# Patient Record
Sex: Male | Born: 1955 | Race: White | Hispanic: No | Marital: Married | Smoking: Never smoker
Health system: Southern US, Community
[De-identification: ages and names within clinical notes are randomized; demographics above are authoritative.]

## PROBLEM LIST (undated history)

## (undated) DIAGNOSIS — Z21 Asymptomatic human immunodeficiency virus [HIV] infection status: Secondary | ICD-10-CM

## (undated) DIAGNOSIS — B20 Human immunodeficiency virus [HIV] disease: Secondary | ICD-10-CM

## (undated) DIAGNOSIS — G4730 Sleep apnea, unspecified: Secondary | ICD-10-CM

## (undated) HISTORY — PX: TUMOR REMOVAL: SHX12

## (undated) HISTORY — PX: COSMETIC SURGERY: SHX468

## (undated) HISTORY — DX: Sleep apnea, unspecified: G47.30

---

## 1987-11-01 ENCOUNTER — Encounter (INDEPENDENT_AMBULATORY_CARE_PROVIDER_SITE_OTHER): Payer: Self-pay | Admitting: *Deleted

## 1987-11-01 LAB — CONVERTED CEMR LAB: CD4 T Cell Abs: 760

## 1997-07-21 ENCOUNTER — Encounter: Admission: RE | Admit: 1997-07-21 | Discharge: 1997-07-21 | Payer: Self-pay | Admitting: Infectious Diseases

## 1997-09-16 ENCOUNTER — Encounter: Admission: RE | Admit: 1997-09-16 | Discharge: 1997-09-16 | Payer: Self-pay | Admitting: Infectious Diseases

## 1997-09-17 ENCOUNTER — Ambulatory Visit (HOSPITAL_COMMUNITY): Admission: RE | Admit: 1997-09-17 | Discharge: 1997-09-17 | Payer: Self-pay | Admitting: General Surgery

## 1997-09-28 ENCOUNTER — Encounter: Admission: RE | Admit: 1997-09-28 | Discharge: 1997-09-28 | Payer: Self-pay | Admitting: Infectious Diseases

## 1998-03-18 ENCOUNTER — Encounter: Admission: RE | Admit: 1998-03-18 | Discharge: 1998-03-18 | Payer: Self-pay | Admitting: Infectious Diseases

## 1998-04-01 ENCOUNTER — Encounter: Admission: RE | Admit: 1998-04-01 | Discharge: 1998-04-01 | Payer: Self-pay | Admitting: Infectious Diseases

## 1998-11-09 ENCOUNTER — Ambulatory Visit (HOSPITAL_COMMUNITY): Admission: RE | Admit: 1998-11-09 | Discharge: 1998-11-09 | Payer: Self-pay | Admitting: Infectious Diseases

## 1998-11-09 ENCOUNTER — Encounter: Admission: RE | Admit: 1998-11-09 | Discharge: 1998-11-09 | Payer: Self-pay | Admitting: Infectious Diseases

## 1998-12-03 ENCOUNTER — Encounter: Admission: RE | Admit: 1998-12-03 | Discharge: 1998-12-03 | Payer: Self-pay | Admitting: Infectious Diseases

## 1998-12-29 ENCOUNTER — Encounter: Admission: RE | Admit: 1998-12-29 | Discharge: 1998-12-29 | Payer: Self-pay | Admitting: Internal Medicine

## 1999-01-12 ENCOUNTER — Encounter: Admission: RE | Admit: 1999-01-12 | Discharge: 1999-01-12 | Payer: Self-pay | Admitting: Internal Medicine

## 1999-01-26 ENCOUNTER — Encounter: Admission: RE | Admit: 1999-01-26 | Discharge: 1999-01-26 | Payer: Self-pay | Admitting: Infectious Diseases

## 1999-02-09 ENCOUNTER — Encounter: Admission: RE | Admit: 1999-02-09 | Discharge: 1999-02-09 | Payer: Self-pay | Admitting: Infectious Diseases

## 1999-02-24 ENCOUNTER — Encounter: Admission: RE | Admit: 1999-02-24 | Discharge: 1999-02-24 | Payer: Self-pay | Admitting: Infectious Diseases

## 1999-02-24 ENCOUNTER — Ambulatory Visit (HOSPITAL_COMMUNITY): Admission: RE | Admit: 1999-02-24 | Discharge: 1999-02-24 | Payer: Self-pay | Admitting: Infectious Diseases

## 1999-12-07 ENCOUNTER — Encounter: Admission: RE | Admit: 1999-12-07 | Discharge: 1999-12-07 | Payer: Self-pay | Admitting: Infectious Diseases

## 1999-12-07 ENCOUNTER — Ambulatory Visit (HOSPITAL_COMMUNITY): Admission: RE | Admit: 1999-12-07 | Discharge: 1999-12-07 | Payer: Self-pay | Admitting: Infectious Diseases

## 1999-12-29 ENCOUNTER — Encounter: Admission: RE | Admit: 1999-12-29 | Discharge: 1999-12-29 | Payer: Self-pay | Admitting: Infectious Diseases

## 2000-03-12 ENCOUNTER — Encounter: Admission: RE | Admit: 2000-03-12 | Discharge: 2000-03-12 | Payer: Self-pay | Admitting: Infectious Diseases

## 2000-04-23 ENCOUNTER — Ambulatory Visit (HOSPITAL_COMMUNITY): Admission: RE | Admit: 2000-04-23 | Discharge: 2000-04-23 | Payer: Self-pay | Admitting: Infectious Diseases

## 2000-04-23 ENCOUNTER — Encounter: Admission: RE | Admit: 2000-04-23 | Discharge: 2000-04-23 | Payer: Self-pay | Admitting: Infectious Diseases

## 2000-05-10 ENCOUNTER — Encounter: Admission: RE | Admit: 2000-05-10 | Discharge: 2000-05-10 | Payer: Self-pay | Admitting: Infectious Diseases

## 2000-08-02 ENCOUNTER — Ambulatory Visit (HOSPITAL_COMMUNITY): Admission: RE | Admit: 2000-08-02 | Discharge: 2000-08-02 | Payer: Self-pay | Admitting: Infectious Diseases

## 2000-08-16 ENCOUNTER — Encounter: Admission: RE | Admit: 2000-08-16 | Discharge: 2000-08-16 | Payer: Self-pay | Admitting: Infectious Diseases

## 2000-09-19 ENCOUNTER — Encounter: Admission: RE | Admit: 2000-09-19 | Discharge: 2000-09-19 | Payer: Self-pay | Admitting: Internal Medicine

## 2000-12-26 ENCOUNTER — Ambulatory Visit (HOSPITAL_COMMUNITY): Admission: RE | Admit: 2000-12-26 | Discharge: 2000-12-26 | Payer: Self-pay | Admitting: Infectious Diseases

## 2000-12-26 ENCOUNTER — Encounter: Admission: RE | Admit: 2000-12-26 | Discharge: 2000-12-26 | Payer: Self-pay | Admitting: Infectious Diseases

## 2001-01-10 ENCOUNTER — Encounter: Admission: RE | Admit: 2001-01-10 | Discharge: 2001-01-10 | Payer: Self-pay | Admitting: Infectious Diseases

## 2001-10-01 ENCOUNTER — Encounter: Admission: RE | Admit: 2001-10-01 | Discharge: 2001-10-01 | Payer: Self-pay | Admitting: Internal Medicine

## 2001-10-01 ENCOUNTER — Ambulatory Visit (HOSPITAL_COMMUNITY): Admission: RE | Admit: 2001-10-01 | Discharge: 2001-10-01 | Payer: Self-pay | Admitting: Infectious Diseases

## 2001-10-10 ENCOUNTER — Encounter: Admission: RE | Admit: 2001-10-10 | Discharge: 2001-10-10 | Payer: Self-pay | Admitting: Infectious Diseases

## 2001-12-10 ENCOUNTER — Ambulatory Visit (HOSPITAL_COMMUNITY): Admission: RE | Admit: 2001-12-10 | Discharge: 2001-12-10 | Payer: Self-pay | Admitting: Infectious Diseases

## 2001-12-10 ENCOUNTER — Encounter: Admission: RE | Admit: 2001-12-10 | Discharge: 2001-12-10 | Payer: Self-pay | Admitting: Infectious Diseases

## 2001-12-26 ENCOUNTER — Encounter: Admission: RE | Admit: 2001-12-26 | Discharge: 2001-12-26 | Payer: Self-pay | Admitting: Infectious Diseases

## 2002-02-06 ENCOUNTER — Ambulatory Visit (HOSPITAL_COMMUNITY): Admission: RE | Admit: 2002-02-06 | Discharge: 2002-02-06 | Payer: Self-pay | Admitting: Infectious Diseases

## 2002-02-06 ENCOUNTER — Encounter: Admission: RE | Admit: 2002-02-06 | Discharge: 2002-02-06 | Payer: Self-pay | Admitting: Infectious Diseases

## 2002-02-27 ENCOUNTER — Encounter: Admission: RE | Admit: 2002-02-27 | Discharge: 2002-02-27 | Payer: Self-pay | Admitting: Infectious Diseases

## 2003-03-05 ENCOUNTER — Encounter: Admission: RE | Admit: 2003-03-05 | Discharge: 2003-03-05 | Payer: Self-pay | Admitting: Infectious Diseases

## 2003-03-05 ENCOUNTER — Ambulatory Visit (HOSPITAL_COMMUNITY): Admission: RE | Admit: 2003-03-05 | Discharge: 2003-03-05 | Payer: Self-pay | Admitting: Infectious Diseases

## 2003-03-05 ENCOUNTER — Encounter: Payer: Self-pay | Admitting: Infectious Diseases

## 2003-03-19 ENCOUNTER — Encounter: Admission: RE | Admit: 2003-03-19 | Discharge: 2003-03-19 | Payer: Self-pay | Admitting: Infectious Diseases

## 2003-04-30 ENCOUNTER — Encounter: Admission: RE | Admit: 2003-04-30 | Discharge: 2003-04-30 | Payer: Self-pay | Admitting: Infectious Diseases

## 2003-06-11 ENCOUNTER — Ambulatory Visit (HOSPITAL_COMMUNITY): Admission: RE | Admit: 2003-06-11 | Discharge: 2003-06-11 | Payer: Self-pay | Admitting: Infectious Diseases

## 2003-06-11 ENCOUNTER — Encounter: Admission: RE | Admit: 2003-06-11 | Discharge: 2003-06-11 | Payer: Self-pay | Admitting: Infectious Diseases

## 2003-06-25 ENCOUNTER — Encounter: Admission: RE | Admit: 2003-06-25 | Discharge: 2003-06-25 | Payer: Self-pay | Admitting: Infectious Diseases

## 2003-08-24 ENCOUNTER — Encounter: Admission: RE | Admit: 2003-08-24 | Discharge: 2003-08-24 | Payer: Self-pay | Admitting: Infectious Diseases

## 2004-09-08 ENCOUNTER — Ambulatory Visit: Payer: Self-pay | Admitting: Infectious Diseases

## 2004-09-08 ENCOUNTER — Encounter (INDEPENDENT_AMBULATORY_CARE_PROVIDER_SITE_OTHER): Payer: Self-pay | Admitting: *Deleted

## 2004-09-08 ENCOUNTER — Ambulatory Visit (HOSPITAL_COMMUNITY): Admission: RE | Admit: 2004-09-08 | Discharge: 2004-09-08 | Payer: Self-pay | Admitting: Infectious Diseases

## 2004-09-22 ENCOUNTER — Ambulatory Visit: Payer: Self-pay | Admitting: Infectious Diseases

## 2005-02-08 ENCOUNTER — Ambulatory Visit: Payer: Self-pay | Admitting: Infectious Diseases

## 2005-02-08 ENCOUNTER — Ambulatory Visit (HOSPITAL_COMMUNITY): Admission: RE | Admit: 2005-02-08 | Discharge: 2005-02-08 | Payer: Self-pay | Admitting: Infectious Diseases

## 2005-02-08 ENCOUNTER — Encounter (INDEPENDENT_AMBULATORY_CARE_PROVIDER_SITE_OTHER): Payer: Self-pay | Admitting: *Deleted

## 2005-02-08 LAB — CONVERTED CEMR LAB
CD4 Count: 610 microliters
HIV 1 RNA Quant: 399 copies/mL

## 2005-03-07 ENCOUNTER — Ambulatory Visit: Payer: Self-pay | Admitting: Infectious Diseases

## 2005-08-17 ENCOUNTER — Encounter: Admission: RE | Admit: 2005-08-17 | Discharge: 2005-08-17 | Payer: Self-pay | Admitting: Infectious Diseases

## 2005-08-17 ENCOUNTER — Ambulatory Visit: Payer: Self-pay | Admitting: Infectious Diseases

## 2005-08-17 ENCOUNTER — Encounter (INDEPENDENT_AMBULATORY_CARE_PROVIDER_SITE_OTHER): Payer: Self-pay | Admitting: *Deleted

## 2005-08-31 ENCOUNTER — Ambulatory Visit: Payer: Self-pay | Admitting: Infectious Diseases

## 2006-06-11 ENCOUNTER — Encounter (INDEPENDENT_AMBULATORY_CARE_PROVIDER_SITE_OTHER): Payer: Self-pay | Admitting: *Deleted

## 2006-06-11 LAB — CONVERTED CEMR LAB

## 2006-06-24 ENCOUNTER — Encounter (INDEPENDENT_AMBULATORY_CARE_PROVIDER_SITE_OTHER): Payer: Self-pay | Admitting: *Deleted

## 2006-09-06 ENCOUNTER — Telehealth: Payer: Self-pay | Admitting: Infectious Diseases

## 2006-09-06 ENCOUNTER — Ambulatory Visit: Payer: Self-pay | Admitting: Infectious Diseases

## 2006-09-06 ENCOUNTER — Encounter: Admission: RE | Admit: 2006-09-06 | Discharge: 2006-09-06 | Payer: Self-pay | Admitting: Infectious Diseases

## 2006-09-06 DIAGNOSIS — Z8619 Personal history of other infectious and parasitic diseases: Secondary | ICD-10-CM

## 2006-09-06 DIAGNOSIS — B001 Herpesviral vesicular dermatitis: Secondary | ICD-10-CM | POA: Insufficient documentation

## 2006-09-06 DIAGNOSIS — B20 Human immunodeficiency virus [HIV] disease: Secondary | ICD-10-CM

## 2006-09-20 ENCOUNTER — Encounter: Payer: Self-pay | Admitting: Infectious Diseases

## 2006-09-27 ENCOUNTER — Ambulatory Visit: Payer: Self-pay | Admitting: Infectious Diseases

## 2006-10-15 ENCOUNTER — Telehealth: Payer: Self-pay | Admitting: Infectious Diseases

## 2007-04-16 ENCOUNTER — Encounter: Payer: Self-pay | Admitting: Infectious Diseases

## 2007-07-29 ENCOUNTER — Telehealth: Payer: Self-pay | Admitting: Infectious Diseases

## 2007-08-26 ENCOUNTER — Encounter: Payer: Self-pay | Admitting: Infectious Diseases

## 2007-09-26 ENCOUNTER — Encounter: Payer: Self-pay | Admitting: Infectious Diseases

## 2007-10-10 ENCOUNTER — Telehealth: Payer: Self-pay | Admitting: Infectious Diseases

## 2008-02-19 ENCOUNTER — Ambulatory Visit: Payer: Self-pay | Admitting: Infectious Diseases

## 2008-02-19 LAB — CONVERTED CEMR LAB
BUN: 17 mg/dL (ref 6–23)
Bilirubin Urine: NEGATIVE
CO2: 23 meq/L (ref 19–32)
Cholesterol: 164 mg/dL (ref 0–200)
Creatinine, Ser: 0.79 mg/dL (ref 0.40–1.50)
Eosinophils Relative: 2 % (ref 0–5)
GC Probe Amp, Urine: NEGATIVE
Glucose, Bld: 97 mg/dL (ref 70–99)
HCT: 37.9 % — ABNORMAL LOW (ref 39.0–52.0)
HIV 1 RNA Quant: 114 copies/mL — ABNORMAL HIGH (ref <48)
Hemoglobin: 13.3 g/dL (ref 13.0–17.0)
Lymphocytes Relative: 29 % (ref 12–46)
Lymphs Abs: 1.6 10*3/uL (ref 0.7–4.0)
Monocytes Absolute: 0.2 10*3/uL (ref 0.1–1.0)
Monocytes Relative: 4 % (ref 3–12)
Protein, ur: NEGATIVE mg/dL
Total Bilirubin: 2.5 mg/dL — ABNORMAL HIGH (ref 0.3–1.2)
Total CHOL/HDL Ratio: 5.5
Total Protein: 7.2 g/dL (ref 6.0–8.3)
Triglycerides: 362 mg/dL — ABNORMAL HIGH (ref <150)
Urine Glucose: NEGATIVE mg/dL
VLDL: 72 mg/dL — ABNORMAL HIGH (ref 0–40)
WBC: 5.7 10*3/uL (ref 4.0–10.5)

## 2008-03-04 ENCOUNTER — Ambulatory Visit: Payer: Self-pay | Admitting: Internal Medicine

## 2008-03-04 DIAGNOSIS — A4902 Methicillin resistant Staphylococcus aureus infection, unspecified site: Secondary | ICD-10-CM | POA: Insufficient documentation

## 2008-06-18 ENCOUNTER — Ambulatory Visit: Payer: Self-pay | Admitting: Internal Medicine

## 2008-06-18 LAB — CONVERTED CEMR LAB
ALT: 36 units/L (ref 0–53)
CO2: 25 meq/L (ref 19–32)
Calcium: 9.5 mg/dL (ref 8.4–10.5)
Chloride: 105 meq/L (ref 96–112)
HIV 1 RNA Quant: 276 copies/mL — ABNORMAL HIGH (ref <48)
Hemoglobin: 14.3 g/dL (ref 13.0–17.0)
Lymphocytes Relative: 35 % (ref 12–46)
Lymphs Abs: 2.7 10*3/uL (ref 0.7–4.0)
MCHC: 35 g/dL (ref 30.0–36.0)
Monocytes Relative: 7 % (ref 3–12)
Neutro Abs: 4.3 10*3/uL (ref 1.7–7.7)
Neutrophils Relative %: 55 % (ref 43–77)
Potassium: 4.1 meq/L (ref 3.5–5.3)
RBC: 4.23 M/uL (ref 4.22–5.81)
Sodium: 141 meq/L (ref 135–145)
Total Protein: 7.2 g/dL (ref 6.0–8.3)

## 2008-07-09 ENCOUNTER — Ambulatory Visit: Payer: Self-pay | Admitting: Infectious Diseases

## 2008-12-24 ENCOUNTER — Ambulatory Visit: Payer: Self-pay | Admitting: Infectious Diseases

## 2008-12-24 LAB — CONVERTED CEMR LAB: HIV 1 RNA Quant: <48 copies/mL (ref <48)

## 2008-12-25 ENCOUNTER — Encounter: Payer: Self-pay | Admitting: Infectious Diseases

## 2008-12-25 LAB — CONVERTED CEMR LAB
AST: 35 units/L (ref 0–37)
Alkaline Phosphatase: 78 units/L (ref 39–117)
BUN: 17 mg/dL (ref 6–23)
Basophils Absolute: 0 10*3/uL (ref 0.0–0.1)
Calcium: 9 mg/dL (ref 8.4–10.5)
Creatinine, Ser: 0.88 mg/dL (ref 0.40–1.50)
Eosinophils Relative: 3 % (ref 0–5)
Glucose, Bld: 123 mg/dL — ABNORMAL HIGH (ref 70–99)
HCT: 36.8 % — ABNORMAL LOW (ref 39.0–52.0)
Hemoglobin: 13.2 g/dL (ref 13.0–17.0)
Lymphocytes Relative: 31 % (ref 12–46)
MCHC: 35.9 g/dL (ref 30.0–36.0)
Monocytes Absolute: 0.6 10*3/uL (ref 0.1–1.0)
Monocytes Relative: 8 % (ref 3–12)
RBC: 3.91 M/uL — ABNORMAL LOW (ref 4.22–5.81)
RDW: 13.2 % (ref 11.5–15.5)

## 2009-01-19 ENCOUNTER — Ambulatory Visit: Payer: Self-pay | Admitting: Internal Medicine

## 2009-06-28 ENCOUNTER — Encounter: Payer: Self-pay | Admitting: Infectious Diseases

## 2009-09-22 ENCOUNTER — Telehealth: Payer: Self-pay | Admitting: Internal Medicine

## 2009-09-23 ENCOUNTER — Ambulatory Visit: Payer: Self-pay | Admitting: Internal Medicine

## 2009-09-23 DIAGNOSIS — J019 Acute sinusitis, unspecified: Secondary | ICD-10-CM

## 2009-09-23 LAB — CONVERTED CEMR LAB
ALT: 68 units/L — ABNORMAL HIGH (ref 0–53)
Albumin: 4.7 g/dL (ref 3.5–5.2)
CO2: 26 meq/L (ref 19–32)
Chloride: 103 meq/L (ref 96–112)
Glucose, Bld: 105 mg/dL — ABNORMAL HIGH (ref 70–99)
HIV 1 RNA Quant: <48 {copies}/mL (ref <48)
HIV-1 RNA Quant, Log: <1.68 (ref <1.68)
Hemoglobin: 14.5 g/dL (ref 13.0–17.0)
Lymphocytes Relative: 36 % (ref 12–46)
Lymphs Abs: 3.6 10*3/uL (ref 0.7–4.0)
MCHC: 35.2 g/dL (ref 30.0–36.0)
Monocytes Absolute: 0.8 10*3/uL (ref 0.1–1.0)
Monocytes Relative: 8 % (ref 3–12)
Neutro Abs: 5.6 10*3/uL (ref 1.7–7.7)
Neutrophils Relative %: 55 % (ref 43–77)
Potassium: 4 meq/L (ref 3.5–5.3)
RBC: 4.25 M/uL (ref 4.22–5.81)
Sodium: 138 meq/L (ref 135–145)
Total Protein: 7.4 g/dL (ref 6.0–8.3)
WBC: 10.1 10*3/uL (ref 4.0–10.5)

## 2009-10-06 ENCOUNTER — Ambulatory Visit: Payer: Self-pay | Admitting: Internal Medicine

## 2010-04-07 ENCOUNTER — Telehealth (INDEPENDENT_AMBULATORY_CARE_PROVIDER_SITE_OTHER): Payer: Self-pay | Admitting: Licensed Clinical Social Worker

## 2010-04-07 ENCOUNTER — Telehealth (INDEPENDENT_AMBULATORY_CARE_PROVIDER_SITE_OTHER): Payer: Self-pay | Admitting: *Deleted

## 2010-05-15 LAB — CONVERTED CEMR LAB
ALT: 12 units/L (ref 0–53)
AST: 15 units/L (ref 0–37)
Albumin: 4.8 g/dL (ref 3.5–5.2)
BUN: 16 mg/dL (ref 6–23)
Basophils Relative: 0 % (ref 0–1)
Bilirubin Urine: NEGATIVE
CO2: 24 meq/L (ref 19–32)
Calcium: 9.4 mg/dL (ref 8.4–10.5)
Chloride: 105 meq/L (ref 96–112)
Hemoglobin: 15.2 g/dL (ref 13.0–17.0)
Ketones, ur: NEGATIVE mg/dL
Lymphocytes Relative: 37 % (ref 12–46)
Lymphs Abs: 2.4 10*3/uL (ref 0.7–3.3)
MCHC: 35.4 g/dL (ref 30.0–36.0)
Monocytes Absolute: 0.5 10*3/uL (ref 0.2–0.7)
Monocytes Relative: 8 % (ref 3–11)
Neutro Abs: 3.4 10*3/uL (ref 1.7–7.7)
Neutrophils Relative %: 53 % (ref 43–77)
Nitrite: NEGATIVE
Potassium: 4.1 meq/L (ref 3.5–5.3)
Protein, ur: NEGATIVE mg/dL
RBC: 4.44 M/uL (ref 4.22–5.81)
Urobilinogen, UA: 0.2 (ref 0.0–1.0)
WBC: 6.4 10*3/uL (ref 4.0–10.5)

## 2010-05-17 NOTE — Medication Information (Signed)
Summary: [facility]: RX  [facility]: RX   Imported By: Florinda Marker 06/30/2009 15:32:52  _____________________________________________________________________  External Attachment:    Type:   Image     Comment:   External Document

## 2010-05-17 NOTE — Assessment & Plan Note (Signed)
Summary: F/U [Adam Simon]   CC:  follow-up visit, lab results, and rash both arms x 1 week.  History of Present Illness: Pt here for f/u.  sinsusitis has resolved. Missed a few doses of his meds.  Preventive Screening-Counseling & Management  Alcohol-Tobacco     Alcohol drinks/day: 0     Smoking Status: never  Caffeine-Diet-Exercise     Caffeine use/day: sodas     Does Patient Exercise: yes     Type of exercise: walking, work out     Exercise (avg: min/session): 30-60     Times/week: 3  Hep-HIV-STD-Contraception     HIV Risk: no risk noted     HIV Risk Counseling: not indicated-no HIV risk noted  Safety-Violence-Falls     Seat Belt Use: yes      Sexual History:  currently monogamous.        Drug Use:  never.     Updated Prior Medication List: REYATAZ 300 MG CAPS (ATAZANAVIR SULFATE) Take 1 capsule by mouth once a day NORVIR 100 MG CAPS (RITONAVIR) Take 1 capsule by mouth once a day TRUVADA 200-300 MG TABS (EMTRICITABINE-TENOFOVIR) Take 1 tablet by mouth once a day ACYCLOVIR 400 MG TABS (ACYCLOVIR) Take 1 tablet by mouth once a day  Current Allergies (reviewed today): No known allergies  Past History:  Past Medical History: Last updated: 09/06/2006 Hepatitis B, hx of HIV disease Genital herpes  Review of Systems  The patient denies anorexia, fever, and weight loss.    Vital Signs:  Patient profile:   55 year old male Height:      73 inches (185.42 cm) Weight:      210.12 pounds (95.51 kg) BMI:     27.82 Temp:     98.0 degrees F (36.67 degrees C) oral Pulse rate:   77 / minute BP sitting:   146 / 88  (left arm)  Vitals Entered By: Wendall Mola CMA Duncan Dull) (October 06, 2009 3:16 PM) CC: follow-up visit, lab results, rash both arms x 1 week Is Patient Diabetic? No Pain Assessment Patient in pain? no      Nutritional Status BMI of 25 - 29 = overweight Nutritional Status Detail appetite "fine"  Does patient need assistance? Functional Status Self  care Ambulation Normal Comments no missed doses of meds per patient   Physical Exam  General:  alert, well-developed, well-nourished, and well-hydrated.   Head:  normocephalic and atraumatic.   Mouth:  pharynx pink and moist.   Lungs:  normal breath sounds.      Impression & Recommendations:  Problem # 1:  HIV DISEASE (ICD-042) Pt.s most recent CD4ct was 1000 and VL <48 .  Pt instructed to continue the current antiretroviral regimen.  Pt encouraged to take medication regularly and not miss doses.  Pt will f/u in 3 months for repeat blood work and will see me 2 weeks later.  Diagnostics Reviewed:  CD4: 1000 (09/24/2009)   WBC: 10.1 (09/23/2009)   PMN (bands): 0 (06/18/2008)   Hgb: 14.5 (09/23/2009)   HCT: 41.2 (09/23/2009)   Platelets: 238 (09/23/2009) HIV-1 RNA: <48 copies/mL (09/23/2009)   HBSAg: No (06/11/2006)  Other Orders: Est. Patient Level III (84132) Future Orders: T-CD4SP ([facility]) (CD4SP) ... 04/04/2010 T-HIV Viral Load [id number]) ... 04/04/2010 T-Comprehensive Metabolic Panel [id number]) ... 04/04/2010 T-CBC w/Diff ([id number]) ... 04/04/2010 T-RPR (Syphilis) [phone]) ... 04/04/2010 T-Lipid Profile [phone]) ... 04/04/2010  Patient Instructions: 1)  Please schedule a follow-up appointment in 6 months, 2 weeks after labs.

## 2010-05-17 NOTE — Assessment & Plan Note (Signed)
Summary: SICK   CC:  pt. c/o sinus congestion x 5 days.  History of Present Illness: Pt c/o sinus congestion and cough for past 5 days.  No fever or chills.  Preventive Screening-Counseling & Management  Alcohol-Tobacco     Alcohol drinks/day: 0     Smoking Status: never  Caffeine-Diet-Exercise     Caffeine use/day: sodas     Does Patient Exercise: yes     Type of exercise: walking, work out     Exercise (avg: min/session): 30-60     Times/week: 3  Hep-HIV-STD-Contraception     HIV Risk: no risk noted     HIV Risk Counseling: not indicated-no HIV risk noted  Safety-Violence-Falls     Seat Belt Use: yes      Sexual History:  currently monogamous.        Drug Use:  never.     Updated Prior Medication List: REYATAZ 300 MG CAPS (ATAZANAVIR SULFATE) Take 1 capsule by mouth once a day NORVIR 100 MG CAPS (RITONAVIR) Take 1 capsule by mouth once a day TRUVADA 200-300 MG TABS (EMTRICITABINE-TENOFOVIR) Take 1 tablet by mouth once a day ACYCLOVIR 400 MG TABS (ACYCLOVIR) Take 1 tablet by mouth once a day ZITHROMAX Z-PAK 250 MG TABS (AZITHROMYCIN) take as directed  Current Allergies (reviewed today): No known allergies  Past History:  Past Medical History: Last updated: 09/06/2006 Hepatitis B, hx of HIV disease Genital herpes  Review of Systems  The patient denies anorexia, fever, weight loss, dyspnea on exertion, and hemoptysis.    Vital Signs:  Patient profile:   55 year old male Height:      73 inches (185.42 cm) Weight:      209.0 pounds (95.00 kg) BMI:     27.67 Temp:     97.8 degrees F (36.56 degrees C) oral Pulse rate:   73 / minute BP sitting:   142 / 91  (right arm)  Vitals Entered By: Wendall Mola CMA Duncan Dull) (September 23, 2009 9:45 AM) CC: pt. c/o sinus congestion x 5 days Is Patient Diabetic? No Pain Assessment Patient in pain? no      Nutritional Status BMI of 25 - 29 = overweight Nutritional Status Detail appetite "less"  Does patient  need assistance? Functional Status Self care Ambulation Normal Comments no missed doses of meds per patient   Physical Exam  General:  alert, well-developed, well-nourished, and well-hydrated.   Head:  normocephalic and atraumatic.   Mouth:  pharynx pink and moist.   Lungs:  normal breath sounds.     Impression & Recommendations:  Problem # 1:  ACUTE SINUSITIS, UNSPECIFIED (ICD-461.9) Pt states that the z-pack usually works for him. Will give him an Rx for that. His updated medication list for this problem includes:    Zithromax Z-pak 250 Mg Tabs (Azithromycin) .Marland Kitchen... Take as directed  Orders: Est. Patient Level III (16109)  Medications Added to Medication List This Visit: 1)  Zithromax Z-pak 250 Mg Tabs (Azithromycin) .... Take as directed Prescriptions: ZITHROMAX Z-PAK 250 MG TABS (AZITHROMYCIN) take as directed  #1 pack x 0   Entered and Authorized by:   Yisroel Ramming MD   Signed by:   Yisroel Ramming MD on 09/23/2009   Method used:   Print then Give to Patient   RxID:   [id number]

## 2010-05-17 NOTE — Progress Notes (Signed)
Summary: Med request  Phone Note Call from Patient   Summary of Call: Pt requesting Z-pack. He states you had a conversation that you would refill this when he calls. I offered him an office visit and he refused. Please advise.   Symptoms: sinus pressure/pain/drainage. Tomasita Morrow RN  September 22, 2009 3:40 PM   Follow-up for Phone Call        ok x 1 Follow-up by: Yisroel Ramming MD,  September 22, 2009 3:42 PM

## 2010-05-19 NOTE — Progress Notes (Signed)
Summary: request for Viagra  Phone Note Call from Patient   Caller: Patient Summary of Call: Pt. called requesting Viagra, not listed on pts. med list.  Called back and left voicemail that pt. would need an appt. to discuss Initial call taken by: Wendall Mola CMA Duncan Dull),  April 07, 2010 3:02 PM

## 2010-05-19 NOTE — Progress Notes (Signed)
  Phone Note Call from Patient   Caller: Patient Call For: Yisroel Ramming MD Summary of Call: Patient called stating that he wanted Viagra but I did not see this medication on his chart. The patient stated that he last had it filled from Dr. Philipp Deputy and he was very upset that we couldn't accomodate him with his request for Viagra. The patient stated that he would be looking for a new physician because we have done a "crappy job this past year".   Initial call taken by: Starleen Arms CMA,  April 07, 2010 3:59 PM

## 2010-05-26 ENCOUNTER — Telehealth: Payer: Self-pay

## 2010-06-08 ENCOUNTER — Other Ambulatory Visit: Payer: Self-pay | Admitting: Dermatology

## 2010-06-23 ENCOUNTER — Telehealth: Payer: Self-pay | Admitting: Infectious Diseases

## 2010-07-05 NOTE — Progress Notes (Signed)
  Phone Note Call from Patient   Caller: Patient Summary of Call: pt left message on voice mail that he has not heard back about an appt with Dr. Maurice March in the past month. stated we "don't care if he lives or dies" needs refills on meds & tired of not being able to see his md. states he only went to Dr. Philipp Deputy since Dr. Bonnetta Barry hours were so irregular. now wants a referral to an doctor who has regular hours. wants a call back today & wants to speak with Dr. Maurice March [phone] Initial call taken by: Golden Circle RN,  June 23, 2010 3:32 PM  Follow-up for Phone Call        I LM at his job for him to call back. we can refill his meds & will get the referral done. can go to [facility] or [facility]. We do not know if those mds are available 5 days a week. Follow-up by: Golden Circle RN,  June 23, 2010 3:41 PM  Additional Follow-up for Phone Call Additional follow up Details #1::        he called back. did not want to travel out of town for appts. told him we have the only ID clinic in [place]. did not want refills. did not want appt. states he just wants to speak with Dr. Maurice March to tell him of the poor service provided over the past 2 years. I apoligized for delays in helping him with refills. told him my name & that I am here Tues & Thurs. told him I will return calls same day & he can ask for me. He agreed to appt. told him I will make him a lab & md appt & call him with times. needs mid week appts as he travels alot. Additional Follow-up by: Golden Circle RN,  June 23, 2010 3:50 PM    Additional Follow-up for Phone Call Additional follow up Details #2::    called him back. lab is 07/07/10 9-4 excluding lunch. will see Dr. Orvan Falconer 07/21/10 at 3;15. HE WAS OK WITH BOTH OF THESE DATES. states he know Dr. Maurice March is retiring. I explained he did not have any appts for months, hence choice of Dr. Orvan Falconer Follow-up by: Golden Circle RN,  June 23, 2010 4:09 PM

## 2010-07-07 ENCOUNTER — Other Ambulatory Visit: Payer: Managed Care, Other (non HMO)

## 2010-07-07 DIAGNOSIS — B20 Human immunodeficiency virus [HIV] disease: Secondary | ICD-10-CM

## 2010-07-08 LAB — COMPLETE METABOLIC PANEL WITH GFR
ALT: 21 U/L (ref 0–53)
AST: 23 U/L (ref 0–37)
Alkaline Phosphatase: 88 U/L (ref 39–117)
CO2: 26 mEq/L (ref 19–32)
Creat: 0.77 mg/dL (ref 0.40–1.50)
GFR, Est African American: >60 mL/min (ref >60)
Sodium: 141 mEq/L (ref 135–145)
Total Bilirubin: 1.2 mg/dL (ref 0.3–1.2)
Total Protein: 7.2 g/dL (ref 6.0–8.3)

## 2010-07-08 LAB — CBC WITH DIFFERENTIAL/PLATELET
Basophils Relative: 0 % (ref 0–1)
Eosinophils Absolute: 0.1 10*3/uL (ref 0.0–0.7)
HCT: 41.8 % (ref 39.0–52.0)
Hemoglobin: 14.1 g/dL (ref 13.0–17.0)
Lymphs Abs: 2.6 10*3/uL (ref 0.7–4.0)
MCH: 33.7 pg (ref 26.0–34.0)
MCHC: 33.7 g/dL (ref 30.0–36.0)
MCV: 99.8 fL (ref 78.0–100.0)
Monocytes Absolute: 0.6 10*3/uL (ref 0.1–1.0)
Monocytes Relative: 8 % (ref 3–12)
Neutrophils Relative %: 57 % (ref 43–77)
RBC: 4.19 MIL/uL — ABNORMAL LOW (ref 4.22–5.81)

## 2010-07-08 LAB — CD4/CD8 (T-HELPER/T-SUPPRESSOR CELL)
CD4%: 34 % (ref 30.0–60.0)
CD8 T Cell Abs: 1370 /uL — ABNORMAL HIGH (ref 230–1000)
Ratio: 0.67 — ABNORMAL LOW (ref 1.0–3.0)
Total lymphocyte count: 2710 /uL (ref 1000–4000)

## 2010-07-08 LAB — RPR

## 2010-07-08 LAB — LIPID PANEL
HDL: 34 mg/dL — ABNORMAL LOW (ref >39)
Total CHOL/HDL Ratio: 4.2 Ratio
VLDL: 31 mg/dL (ref 0–40)

## 2010-07-09 LAB — HIV-1 RNA QUANT-NO REFLEX-BLD
HIV 1 RNA Quant: <20 copies/mL (ref <20)
HIV-1 RNA Quant, Log: <1.3 {Log} (ref <1.30)

## 2010-07-14 NOTE — Progress Notes (Signed)
Summary: Request to change back to Dr Maurice March.  Phone Note Call from Patient   Caller: [phone] Summary of Call: Pt would like to transfer back to Dr Maurice March.  He state Dr Maurice March has been his physician for 12 years and he was only seeing Dr Philipp Deputy because Dr Maurice March kept changing his schedule.  Tomasita Morrow RN  May 26, 2010 2:14 PM  Initial call taken by: Tomasita Morrow RN,  May 26, 2010 2:14 PM  Follow-up for Phone Call        ok to swtich.   Follow-up by: Tomasita Morrow RN,  July 06, 2010 4:57 PM

## 2010-07-21 ENCOUNTER — Ambulatory Visit: Payer: Managed Care, Other (non HMO) | Admitting: Internal Medicine

## 2010-07-21 NOTE — Progress Notes (Unsigned)
  Subjective:    Patient ID: Adam Simon, male    DOB: Apr 24, 1955, 55 y.o.   MRN: [id number]  HPI    Review of Systems     Objective:   Physical Exam         Assessment & Plan:

## 2010-07-28 LAB — T-HELPER CELL (CD4) - (RCID CLINIC ONLY): CD4 T Cell Abs: 740 uL (ref 400–2700)

## 2010-08-04 ENCOUNTER — Ambulatory Visit (INDEPENDENT_AMBULATORY_CARE_PROVIDER_SITE_OTHER): Payer: Managed Care, Other (non HMO) | Admitting: Internal Medicine

## 2010-08-04 ENCOUNTER — Encounter: Payer: Self-pay | Admitting: Internal Medicine

## 2010-08-04 VITALS — BP 150/87 | HR 79 | Temp 98.2°F | Ht 73.0 in | Wt 196.5 lb

## 2010-08-04 DIAGNOSIS — B20 Human immunodeficiency virus [HIV] disease: Secondary | ICD-10-CM

## 2010-08-04 DIAGNOSIS — R259 Unspecified abnormal involuntary movements: Secondary | ICD-10-CM

## 2010-08-04 DIAGNOSIS — R251 Tremor, unspecified: Secondary | ICD-10-CM

## 2010-08-04 MED ORDER — RITONAVIR 100 MG PO TABS: 100.0000 mg | ORAL_TABLET | Freq: Every day | ORAL | Status: DC

## 2010-08-04 NOTE — Assessment & Plan Note (Signed)
Michaels HIV infection remains under excellent control. His CD4 count is less than 20 and his viral load is 950. He does not have much clinical evidence of lipodystrophy and is not terribly interested in giving himself injections so we will hold off on Egrifta.

## 2010-08-04 NOTE — Progress Notes (Signed)
  Subjective:    Patient ID: Adam Simon, male    DOB: 19-Dec-1955, 55 y.o.   MRN: [id number]  HPI Adam Simon is in for his routine visit at his first visit with me. 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 He has been living with HIV infection for 17 years. He is been followed previously by one of my partners Dr. Darlina Sicilian. He has been on Truvada, Reyataz, Norvir for at least the past 4 years without difficulty. He does not recall missing any doses. He is well and does not have any current problems. He does inquire about the potential benefits of Egrifta. He is sexually active with one male partner and they do not use condoms.    Review of Systems     Objective:   Physical Exam  Constitutional: He appears well-developed and well-nourished. No distress.  HENT:  Mouth/Throat: Oropharynx is clear and moist. No oropharyngeal exudate.  Cardiovascular: Normal rate, regular rhythm and normal heart sounds.   No murmur heard. Pulmonary/Chest: Breath sounds normal. He has no wheezes. He has no rales.  Psychiatric: He has a normal mood and affect.          Assessment & Plan:

## 2010-12-26 ENCOUNTER — Other Ambulatory Visit: Payer: Self-pay | Admitting: *Deleted

## 2010-12-26 DIAGNOSIS — B20 Human immunodeficiency virus [HIV] disease: Secondary | ICD-10-CM

## 2010-12-26 DIAGNOSIS — B009 Herpesviral infection, unspecified: Secondary | ICD-10-CM

## 2010-12-26 DIAGNOSIS — R251 Tremor, unspecified: Secondary | ICD-10-CM

## 2010-12-26 MED ORDER — ACYCLOVIR 400 MG PO TABS: 400.0000 mg | ORAL_TABLET | Freq: Every day | ORAL | Status: DC

## 2010-12-26 MED ORDER — PROPRANOLOL HCL 10 MG PO TABS: 10.0000 mg | ORAL_TABLET | Freq: Three times a day (TID) | ORAL | Status: DC

## 2010-12-26 MED ORDER — RITONAVIR 100 MG PO TABS: 100.0000 mg | ORAL_TABLET | Freq: Every day | ORAL | Status: DC

## 2010-12-26 MED ORDER — EMTRICITABINE-TENOFOVIR DF 200-300 MG PO TABS: 1.0000 | ORAL_TABLET | Freq: Every day | ORAL | Status: DC

## 2010-12-26 MED ORDER — ATAZANAVIR SULFATE 300 MG PO CAPS: 300.0000 mg | ORAL_CAPSULE | Freq: Every day | ORAL | Status: DC

## 2011-01-17 LAB — T-HELPER CELL (CD4) - (RCID CLINIC ONLY): CD4 % Helper T Cell: 37

## 2011-10-20 ENCOUNTER — Other Ambulatory Visit: Payer: Self-pay | Admitting: Infectious Diseases

## 2011-11-05 ENCOUNTER — Other Ambulatory Visit: Payer: Self-pay | Admitting: Internal Medicine

## 2011-12-05 ENCOUNTER — Other Ambulatory Visit: Payer: Self-pay | Admitting: Internal Medicine

## 2011-12-05 DIAGNOSIS — Z113 Encounter for screening for infections with a predominantly sexual mode of transmission: Secondary | ICD-10-CM

## 2011-12-05 DIAGNOSIS — Z79899 Other long term (current) drug therapy: Secondary | ICD-10-CM

## 2011-12-05 DIAGNOSIS — B20 Human immunodeficiency virus [HIV] disease: Secondary | ICD-10-CM

## 2011-12-07 ENCOUNTER — Other Ambulatory Visit (INDEPENDENT_AMBULATORY_CARE_PROVIDER_SITE_OTHER): Payer: Managed Care, Other (non HMO)

## 2011-12-07 DIAGNOSIS — Z113 Encounter for screening for infections with a predominantly sexual mode of transmission: Secondary | ICD-10-CM

## 2011-12-07 DIAGNOSIS — Z79899 Other long term (current) drug therapy: Secondary | ICD-10-CM

## 2011-12-07 DIAGNOSIS — B20 Human immunodeficiency virus [HIV] disease: Secondary | ICD-10-CM

## 2011-12-07 LAB — CBC WITH DIFFERENTIAL/PLATELET
Eosinophils Absolute: 0.1 10*3/uL (ref 0.0–0.7)
Eosinophils Relative: 1 % (ref 0–5)
HCT: 39.3 % (ref 39.0–52.0)
Lymphocytes Relative: 34 % (ref 12–46)
Lymphs Abs: 3.4 10*3/uL (ref 0.7–4.0)
MCH: 33.3 pg (ref 26.0–34.0)
MCV: 94.2 fL (ref 78.0–100.0)
Monocytes Absolute: 0.7 10*3/uL (ref 0.1–1.0)
Platelets: 212 10*3/uL (ref 150–400)
RBC: 4.17 MIL/uL — ABNORMAL LOW (ref 4.22–5.81)

## 2011-12-07 LAB — COMPREHENSIVE METABOLIC PANEL
BUN: 16 mg/dL (ref 6–23)
CO2: 27 mEq/L (ref 19–32)
Calcium: 9.2 mg/dL (ref 8.4–10.5)
Chloride: 103 mEq/L (ref 96–112)
Creat: 1.33 mg/dL (ref 0.50–1.35)
Glucose, Bld: 93 mg/dL (ref 70–99)
Total Bilirubin: 1.1 mg/dL (ref 0.3–1.2)

## 2011-12-07 LAB — LIPID PANEL
Cholesterol: 200 mg/dL (ref 0–200)
LDL Cholesterol: 107 mg/dL — ABNORMAL HIGH (ref 0–99)
Triglycerides: 305 mg/dL — ABNORMAL HIGH (ref <150)

## 2011-12-08 LAB — HIV-1 RNA QUANT-NO REFLEX-BLD
HIV 1 RNA Quant: <20 copies/mL (ref <20)
HIV-1 RNA Quant, Log: <1.3 {Log} (ref <1.30)

## 2011-12-21 ENCOUNTER — Ambulatory Visit: Payer: Managed Care, Other (non HMO) | Admitting: Internal Medicine

## 2011-12-23 ENCOUNTER — Other Ambulatory Visit: Payer: Self-pay | Admitting: Internal Medicine

## 2011-12-28 ENCOUNTER — Encounter: Payer: Self-pay | Admitting: Internal Medicine

## 2011-12-28 ENCOUNTER — Ambulatory Visit (INDEPENDENT_AMBULATORY_CARE_PROVIDER_SITE_OTHER): Payer: Managed Care, Other (non HMO) | Admitting: Internal Medicine

## 2011-12-28 VITALS — BP 131/81 | HR 71 | Temp 98.3°F | Ht 73.0 in | Wt 207.7 lb

## 2011-12-28 DIAGNOSIS — E785 Hyperlipidemia, unspecified: Secondary | ICD-10-CM

## 2011-12-28 DIAGNOSIS — B20 Human immunodeficiency virus [HIV] disease: Secondary | ICD-10-CM

## 2011-12-28 DIAGNOSIS — Z23 Encounter for immunization: Secondary | ICD-10-CM

## 2011-12-28 MED ORDER — SILDENAFIL CITRATE 100 MG PO TABS: 100.0000 mg | ORAL_TABLET | ORAL | Status: DC | PRN

## 2011-12-28 NOTE — Progress Notes (Signed)
Patient ID: Adam Simon, male   DOB: [birth year], 56 y.o.   MRN: [id number]     [facility] for Infectious Disease  Patient Active Problem List  Diagnosis  . METHICILLIN RESISTANT STAPHYLOCOCCUS AUREUS INFECTION  . HIV DISEASE  . GENITAL HERPES  . ACUTE SINUSITIS, UNSPECIFIED  . HEPATITIS B, HX OF  . Dyslipidemia    Patient's Medications  New Prescriptions   No medications on file  Previous Medications   ACYCLOVIR (ZOVIRAX) 400 MG TABLET    Take 1 tablet (400 mg total) by mouth daily.   ATAZANAVIR (REYATAZ) 300 MG CAPSULE    Take 1 capsule (300 mg total) by mouth daily.   NORVIR 100 MG TABS    TAKE 1 TABLET DAILY   PROPRANOLOL (INDERAL) 10 MG TABLET    TAKE 1 TABLET THREE TIMES A DAY   TRUVADA 200-300 MG PER TABLET    TAKE 1 TABLET DAILY  Modified Medications   Modified Medication Previous Medication   SILDENAFIL (VIAGRA) 100 MG TABLET VIAGRA 100 MG tablet      Take 1 tablet (100 mg total) by mouth as needed for erectile dysfunction.    TAKE AS DIRECTED  Discontinued Medications   No medications on file    Subjective: Adam Simon is in for his routine visit. 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 As usual he never misses a single dose of his Truvada, Reyataz or Norvir. He states that he is doing well with the exception that "he is getting older and fatter".  Objective: Temp: 98.3 F (36.8 C) (09/12 1034) Temp src: Oral (09/12 1034) BP: 131/81 mmHg (09/12 1034) Pulse Rate: 71  (09/12 1034)  General: He is in good spirits Skin: No rash Lungs: Clear Cor: Regular S1-S2 no murmurs Abdomen: Soft nontender   Lab Results HIV 1 RNA Quant (copies/mL)  Date Value  12/07/2011 <20   07/07/2010 <20   09/23/2009 <48 copies/mL      CD4 T Cell Abs (cmm)  Date Value  12/07/2011 1130   09/23/2009 1000   12/24/2008 770      Lab Results  Component Value Date   CHOL 200 12/07/2011   HDL 32* 12/07/2011   LDLCALC 107*  12/07/2011   TRIG 305* 12/07/2011   CHOLHDL 6.3 12/07/2011    Assessment: His HIV infection remains under excellent control. I will continue his current regimen.  He has dyslipidemia. I talked to him about lifestyle modification and he would like to try dietary management before considering medical therapy.  His blood pressure remains under good control.  Plan: 1. Continue current medications 2. Lifestyle modification 3. Influenza vaccine 4. Followup after lab work in 6 months   Cliffton Asters, MD [facility] for Infectious Disease [facility] [phone] pager   [phone] cell 12/28/2011, 11:05 AM

## 2012-01-03 ENCOUNTER — Other Ambulatory Visit: Payer: Self-pay | Admitting: *Deleted

## 2012-01-03 DIAGNOSIS — N529 Male erectile dysfunction, unspecified: Secondary | ICD-10-CM

## 2012-01-03 DIAGNOSIS — B20 Human immunodeficiency virus [HIV] disease: Secondary | ICD-10-CM

## 2012-01-03 MED ORDER — RITONAVIR 100 MG PO TABS: 100.0000 mg | ORAL_TABLET | Freq: Every day | ORAL | Status: DC

## 2012-01-03 MED ORDER — SILDENAFIL CITRATE 100 MG PO TABS: 100.0000 mg | ORAL_TABLET | ORAL | Status: DC | PRN

## 2012-01-03 MED ORDER — EMTRICITABINE-TENOFOVIR DF 200-300 MG PO TABS: 1.0000 | ORAL_TABLET | Freq: Every day | ORAL | Status: DC

## 2012-01-03 NOTE — Telephone Encounter (Signed)
Pt needs refill Viagra rx to the [facility] [address] at [address].  RN will mail the pt a co-pay card for the rx.  Phone call to mail order pharmacy to cancel rx.  Also refilled Reyataz w/ [facility] mail order.

## 2012-02-13 ENCOUNTER — Other Ambulatory Visit: Payer: Self-pay | Admitting: Internal Medicine

## 2012-06-18 ENCOUNTER — Other Ambulatory Visit: Payer: Managed Care, Other (non HMO)

## 2012-06-19 ENCOUNTER — Other Ambulatory Visit: Payer: Managed Care, Other (non HMO)

## 2012-06-19 DIAGNOSIS — B20 Human immunodeficiency virus [HIV] disease: Secondary | ICD-10-CM

## 2012-06-19 LAB — CBC
HCT: 37.4 % — ABNORMAL LOW (ref 39.0–52.0)
Hemoglobin: 13.2 g/dL (ref 13.0–17.0)
MCH: 33.8 pg (ref 26.0–34.0)
MCV: 95.7 fL (ref 78.0–100.0)
RBC: 3.91 MIL/uL — ABNORMAL LOW (ref 4.22–5.81)

## 2012-06-19 LAB — LIPID PANEL
Cholesterol: 143 mg/dL (ref 0–200)
Total CHOL/HDL Ratio: 3.9 Ratio
Triglycerides: 100 mg/dL (ref <150)
VLDL: 20 mg/dL (ref 0–40)

## 2012-06-19 LAB — COMPREHENSIVE METABOLIC PANEL
BUN: 15 mg/dL (ref 6–23)
CO2: 27 mEq/L (ref 19–32)
Calcium: 9 mg/dL (ref 8.4–10.5)
Chloride: 106 mEq/L (ref 96–112)
Creat: 0.79 mg/dL (ref 0.50–1.35)
Glucose, Bld: 79 mg/dL (ref 70–99)
Total Bilirubin: 2.4 mg/dL — ABNORMAL HIGH (ref 0.3–1.2)

## 2012-06-20 LAB — T-HELPER CELL (CD4) - (RCID CLINIC ONLY)
CD4 % Helper T Cell: 34 % (ref 33–55)
CD4 T Cell Abs: 1140 uL (ref 400–2700)

## 2012-07-02 ENCOUNTER — Ambulatory Visit: Payer: Managed Care, Other (non HMO) | Admitting: Internal Medicine

## 2012-07-10 ENCOUNTER — Other Ambulatory Visit: Payer: Self-pay | Admitting: Infectious Disease

## 2012-07-10 DIAGNOSIS — I10 Essential (primary) hypertension: Secondary | ICD-10-CM

## 2012-07-18 ENCOUNTER — Encounter: Payer: Self-pay | Admitting: Internal Medicine

## 2012-07-18 ENCOUNTER — Ambulatory Visit (INDEPENDENT_AMBULATORY_CARE_PROVIDER_SITE_OTHER): Payer: Managed Care, Other (non HMO) | Admitting: Internal Medicine

## 2012-07-18 ENCOUNTER — Other Ambulatory Visit: Payer: Self-pay | Admitting: Infectious Diseases

## 2012-07-18 VITALS — BP 148/85 | HR 81 | Temp 98.6°F | Ht 73.0 in | Wt 200.2 lb

## 2012-07-18 DIAGNOSIS — N529 Male erectile dysfunction, unspecified: Secondary | ICD-10-CM

## 2012-07-18 DIAGNOSIS — B20 Human immunodeficiency virus [HIV] disease: Secondary | ICD-10-CM

## 2012-07-18 DIAGNOSIS — G250 Essential tremor: Secondary | ICD-10-CM

## 2012-07-18 MED ORDER — SILDENAFIL CITRATE 100 MG PO TABS: 100.0000 mg | ORAL_TABLET | ORAL | Status: DC | PRN

## 2012-07-18 NOTE — Progress Notes (Signed)
Patient ID: Adam Simon, male   DOB: March 27, 1956, 57 y.o.   MRN: [id number]          [facility] for Infectious Disease  Patient Active Problem List  Diagnosis  . HIV DISEASE  . GENITAL HERPES  . HEPATITIS B, HX OF  . Dyslipidemia    Patient's Medications  New Prescriptions   No medications on file  Previous Medications   ACYCLOVIR (ZOVIRAX) 400 MG TABLET    TAKE 1 TABLET DAILY   ATAZANAVIR (REYATAZ) 300 MG CAPSULE    Take 1 capsule (300 mg total) by mouth daily.   EMTRICITABINE-TENOFOVIR (TRUVADA) 200-300 MG PER TABLET    Take 1 tablet by mouth daily.   PROPRANOLOL (INDERAL) 10 MG TABLET    TAKE 1 TABLET THREE TIMES A DAY   RITONAVIR (NORVIR) 100 MG TABS    Take 1 tablet (100 mg total) by mouth daily.  Modified Medications   Modified Medication Previous Medication   SILDENAFIL (VIAGRA) 100 MG TABLET sildenafil (VIAGRA) 100 MG tablet      Take 1 tablet (100 mg total) by mouth as needed for erectile dysfunction.    Take 1 tablet (100 mg total) by mouth as needed for erectile dysfunction.  Discontinued Medications   No medications on file    Subjective: Adam Simon is in for his routine followup visit. As usual he never misses a single dose of his HIV medications. His only problem since his last visit has been a nontender nodule that has developed on his left scalp. He came up several months ago and has been slowly enlarging.  Objective: Temp: 98.6 F (37 C) (04/03 0915) Temp src: Oral (04/03 0915) BP: 148/85 mmHg (04/03 0915) Pulse Rate: 81 (04/03 0915)  General: He is in good spirits Skin: There is a nontender nodule on his left scalp that is about 5 mm in diameter. There is no hyperpigmentation or drainage. Lungs: Clear Cor: Regular S1 and S2 no murmurs Abdomen: Nontender  Lab Results HIV 1 RNA Quant (copies/mL)  Date Value  06/19/2012 <20   12/07/2011 <20   07/07/2010 <20      CD4 T Cell Abs (cmm)  Date Value  06/19/2012 1140   12/07/2011 1130   09/23/2009  1000      Lab Results  Component Value Date   CHOL 143 06/19/2012   HDL 37* 06/19/2012   LDLCALC 86 06/19/2012   TRIG 100 06/19/2012   CHOLHDL 3.9 06/19/2012   Assessment: His HIV infection remains under excellent control. I will continue his current antiretroviral regimen.  His lipid panel has improved dramatically with lifestyle modification.  The nodule on the scalp appears benign but I suggested that he see his dermatologist for further evaluation if he continues to enlarge.  Plan: 1. Continue current medications 2. Followup after lab work in 6 months   Cliffton Asters, MD [facility] for Infectious Disease [facility] [phone] pager   [phone] cell 07/18/2012, 9:29 AM

## 2012-07-19 ENCOUNTER — Telehealth: Payer: Self-pay | Admitting: *Deleted

## 2012-07-19 NOTE — Telephone Encounter (Signed)
Patient called and advised that his Rx was not sent to the pharmacy. I called the pharmacy and found that the Rx was received they can not fill it until 07/25/12 as the patient had a delivery recently and it is not due until then. Called the patient and informed him of this information. He was not pleased and had questions, advised him to call his insurance if he has any as we can only give him the info they give [place].

## 2012-08-07 ENCOUNTER — Encounter (HOSPITAL_COMMUNITY): Payer: Self-pay | Admitting: Emergency Medicine

## 2012-08-07 ENCOUNTER — Emergency Department (HOSPITAL_COMMUNITY): Payer: Managed Care, Other (non HMO)

## 2012-08-07 ENCOUNTER — Telehealth: Payer: Self-pay | Admitting: *Deleted

## 2012-08-07 ENCOUNTER — Emergency Department (HOSPITAL_COMMUNITY)
Admission: EM | Admit: 2012-08-07 | Discharge: 2012-08-08 | Disposition: A | Discharge status: Home / Self Care | Payer: Managed Care, Other (non HMO) | Attending: Emergency Medicine | Admitting: Emergency Medicine

## 2012-08-07 DIAGNOSIS — Z21 Asymptomatic human immunodeficiency virus [HIV] infection status: Secondary | ICD-10-CM | POA: Insufficient documentation

## 2012-08-07 DIAGNOSIS — Z79899 Other long term (current) drug therapy: Secondary | ICD-10-CM | POA: Insufficient documentation

## 2012-08-07 DIAGNOSIS — K047 Periapical abscess without sinus: Secondary | ICD-10-CM | POA: Insufficient documentation

## 2012-08-07 HISTORY — DX: Asymptomatic human immunodeficiency virus (hiv) infection status: Z21

## 2012-08-07 HISTORY — DX: Human immunodeficiency virus (HIV) disease: B20

## 2012-08-07 LAB — COMPREHENSIVE METABOLIC PANEL
ALT: 27 U/L (ref 0–53)
Alkaline Phosphatase: 88 U/L (ref 39–117)
BUN: 10 mg/dL (ref 6–23)
CO2: 26 mEq/L (ref 19–32)
Chloride: 99 mEq/L (ref 96–112)
GFR calc Af Amer: >90 mL/min (ref >90)
Glucose, Bld: 93 mg/dL (ref 70–99)
Potassium: 4 mEq/L (ref 3.5–5.1)
Sodium: 138 mEq/L (ref 135–145)
Total Bilirubin: 2.3 mg/dL — ABNORMAL HIGH (ref 0.3–1.2)

## 2012-08-07 LAB — CBC WITH DIFFERENTIAL/PLATELET
Hemoglobin: 14.3 g/dL (ref 13.0–17.0)
Lymphocytes Relative: 19 % (ref 12–46)
Lymphs Abs: 2.7 10*3/uL (ref 0.7–4.0)
MCH: 34.7 pg — ABNORMAL HIGH (ref 26.0–34.0)
Monocytes Relative: 9 % (ref 3–12)
Neutro Abs: 9.9 10*3/uL — ABNORMAL HIGH (ref 1.7–7.7)
Neutrophils Relative %: 71 % (ref 43–77)
RBC: 4.12 MIL/uL — ABNORMAL LOW (ref 4.22–5.81)
WBC: 14 10*3/uL — ABNORMAL HIGH (ref 4.0–10.5)

## 2012-08-07 MED ORDER — CLINDAMYCIN PHOSPHATE 600 MG/50ML IV SOLN
600.0000 mg | Freq: Once | INTRAVENOUS | Status: AC
  Administered 2012-08-07: 600 mg via INTRAVENOUS
  Filled 2012-08-07: qty 50

## 2012-08-07 MED ORDER — IOHEXOL 300 MG/ML  SOLN
100.0000 mL | Freq: Once | INTRAMUSCULAR | Status: AC | PRN
  Administered 2012-08-07: 100 mL via INTRAVENOUS

## 2012-08-07 MED ORDER — CLINDAMYCIN HCL 300 MG PO CAPS: 300.0000 mg | ORAL_CAPSULE | Freq: Four times a day (QID) | ORAL | Status: DC

## 2012-08-07 MED ORDER — MORPHINE SULFATE 4 MG/ML IJ SOLN
4.0000 mg | Freq: Once | INTRAMUSCULAR | Status: AC
  Administered 2012-08-07: 4 mg via INTRAVENOUS
  Filled 2012-08-07: qty 1

## 2012-08-07 MED ORDER — OXYCODONE-ACETAMINOPHEN 5-325 MG PO TABS: 2.0000 | ORAL_TABLET | ORAL | Status: DC | PRN

## 2012-08-07 NOTE — ED Notes (Signed)
Pt seen by MD yesterday placed on antx

## 2012-08-07 NOTE — Telephone Encounter (Signed)
Patient called and advised that his face is swollen and painful. He advised he has seen a dentist and was told it is not a dental issue he wants to be seen today. Looked at the schedule and advised him we could see him tomorrow if he could wait. He advised he was in severe pain and could not wait. Placed the patient on hold to talk with staff to see if there was any way to work him in today and tried to offer him this afternoon after 3 pm with Dr Luciana Axe and the patient advised he was in to much pain and he will go to an urgent care or the ED asap.

## 2012-08-07 NOTE — ED Notes (Signed)
Patient reports swelling to his left jaw x 1 week. Dentist reports that it is not dental related. On antibiotics with no change

## 2012-08-07 NOTE — ED Provider Notes (Signed)
History     CSN: [id number]  Arrival date & time 08/07/12  1932   First MD Initiated Contact with Patient 08/07/12 2032      Chief Complaint  Patient presents with  . Facial Swelling    (Consider location/radiation/quality/duration/timing/severity/associated sxs/prior treatment) HPI Comments: Patient with history of HIV disease diagnosed 20 years ago.  Presents today with left sided facial swelling that started one week ago.  He was started on augmentin for suspected dental infection but has not improved.  He saw his dentist yesterday who did not feel it was dental related and was told to go to the ER if it worsened.  No fevers or chills.    He is on antiviral medications and is followed by ID in [place].  He had his counts checked last week and everything was appropriate.    The history is provided by the patient.    Past Medical History  Diagnosis Date  . HIV (human immunodeficiency virus infection)     History reviewed. No pertinent past surgical history.  No family history on file.  History  Substance Use Topics  . Smoking status: Never Smoker   . Smokeless tobacco: Never Used  . Alcohol Use: Yes     Comment: occ      Review of Systems  All other systems reviewed and are negative.    Allergies  Review of patient's allergies indicates no known allergies.  Home Medications   Current Outpatient Rx  Name  Route  Sig  Dispense  Refill  . acyclovir (ZOVIRAX) 400 MG tablet      TAKE 1 TABLET DAILY   90 tablet   2   . amoxicillin-clavulanate (AUGMENTIN) 875-125 MG per tablet   Oral   Take 1 tablet by mouth 2 (two) times daily. For 10 days         . atazanavir (REYATAZ) 300 MG capsule   Oral   Take 1 capsule (300 mg total) by mouth daily.   90 capsule   3   . emtricitabine-tenofovir (TRUVADA) 200-300 MG per tablet   Oral   Take 1 tablet by mouth daily.   90 tablet   3   . ibuprofen (ADVIL,MOTRIN) 200 MG tablet   Oral   Take 400 mg by mouth  every 8 (eight) hours as needed for pain.         Marland Kitchen oxyCODONE-acetaminophen (PERCOCET/ROXICET) 5-325 MG per tablet   Oral   Take 1 tablet by mouth every 6 (six) hours as needed for pain.         Marland Kitchen propranolol (INDERAL) 10 MG tablet      TAKE 1 TABLET THREE TIMES A DAY   270 tablet   1   . ritonavir (NORVIR) 100 MG TABS   Oral   Take 1 tablet (100 mg total) by mouth daily.   90 tablet   3   . sildenafil (VIAGRA) 100 MG tablet   Oral   Take 1 tablet (100 mg total) by mouth as needed for erectile dysfunction.   20 tablet   2     BP 169/85  Pulse 90  Temp(Src) 98.5 F (36.9 C) (Oral)  Resp 20  SpO2 100%  Physical Exam  Nursing note and vitals reviewed. Constitutional: He is oriented to person, place, and time. He appears well-developed and well-nourished. No distress.  HENT:  Head: Normocephalic and atraumatic.  Mouth/Throat: Oropharynx is clear and moist.  There is significant swelling, ttp of the left mandibular  region.  There is no redness or erythema.  The dentition appears normal.    Neck: Normal range of motion. Neck supple.  Neurological: He is alert and oriented to person, place, and time.  Skin: Skin is warm and dry. He is not diaphoretic.    ED Course  Procedures (including critical care time)  Labs Reviewed  CBC WITH DIFFERENTIAL  COMPREHENSIVE METABOLIC PANEL   No results found.   No diagnosis found.    MDM  The patient presents here with pain and swelling to the left jaw for the past week.  His dentist does not feel as though this is related to his teeth.  He is on antibiotics but not improving.  The workup today reveals an elevated wbc of 14k and ct shows what appears to be a dental abscess with soft tissue swelling and inflammation.  I have ordered clindamycin iv and will discharge with clinda and percocet.  He is also to continue the augmentin.  He has a Education officer, community and will speak with him tomorrow regarding referral to an oral surgeon.  There  is no oral surgeon on call this evening.          Geoffery Lyons, MD 08/07/12 [phone]

## 2012-08-07 NOTE — ED Notes (Signed)
Pt c/o facial swelling x 1 week; pt sts seen by dentist yesterday and told "swelling is not tooth related". Pt sts was given antibiotics and percocet for pain, sts no pain relief.

## 2012-08-08 NOTE — ED Notes (Signed)
rx x 2 given for clindamycin and percocet- pt has a ride- ice pack given for home use

## 2012-10-19 ENCOUNTER — Other Ambulatory Visit: Payer: Self-pay | Admitting: Internal Medicine

## 2013-01-05 ENCOUNTER — Other Ambulatory Visit: Payer: Self-pay | Admitting: Internal Medicine

## 2013-01-20 ENCOUNTER — Other Ambulatory Visit: Payer: Self-pay | Admitting: Internal Medicine

## 2013-01-20 DIAGNOSIS — B20 Human immunodeficiency virus [HIV] disease: Secondary | ICD-10-CM

## 2013-01-21 ENCOUNTER — Other Ambulatory Visit (INDEPENDENT_AMBULATORY_CARE_PROVIDER_SITE_OTHER): Payer: Managed Care, Other (non HMO)

## 2013-01-21 DIAGNOSIS — B20 Human immunodeficiency virus [HIV] disease: Secondary | ICD-10-CM

## 2013-01-23 LAB — HIV-1 RNA QUANT-NO REFLEX-BLD: HIV 1 RNA Quant: 84 copies/mL — ABNORMAL HIGH (ref <20)

## 2013-01-29 ENCOUNTER — Other Ambulatory Visit: Payer: Self-pay | Admitting: Internal Medicine

## 2013-02-04 ENCOUNTER — Encounter (INDEPENDENT_AMBULATORY_CARE_PROVIDER_SITE_OTHER): Payer: Self-pay

## 2013-02-04 ENCOUNTER — Ambulatory Visit (INDEPENDENT_AMBULATORY_CARE_PROVIDER_SITE_OTHER): Payer: Managed Care, Other (non HMO) | Admitting: Internal Medicine

## 2013-02-04 ENCOUNTER — Encounter: Payer: Self-pay | Admitting: Internal Medicine

## 2013-02-04 VITALS — BP 125/81 | HR 76 | Temp 98.6°F | Ht 73.0 in | Wt 211.8 lb

## 2013-02-04 DIAGNOSIS — Z23 Encounter for immunization: Secondary | ICD-10-CM

## 2013-02-04 DIAGNOSIS — B20 Human immunodeficiency virus [HIV] disease: Secondary | ICD-10-CM

## 2013-02-04 MED ORDER — ATAZANAVIR SULFATE 300 MG PO CAPS: 300.0000 mg | ORAL_CAPSULE | Freq: Every day | ORAL | Status: DC

## 2013-02-04 NOTE — Progress Notes (Signed)
Patient ID: Adam Simon, male   DOB: 1955/09/19, 57 y.o.   MRN: [id number]          [facility] for Infectious Disease  Patient Active Problem List   Diagnosis Date Noted  . HIV DISEASE 09/06/2006    Priority: High  . Tremor, hereditary, benign 07/18/2012  . Dyslipidemia 12/28/2011  . GENITAL HERPES 09/06/2006  . HEPATITIS B, HX OF 09/06/2006    Patient's Medications  New Prescriptions   No medications on file  Previous Medications   ACYCLOVIR (ZOVIRAX) 400 MG TABLET    TAKE 1 TABLET DAILY   CLINDAMYCIN (CLEOCIN) 300 MG CAPSULE    Take 1 capsule (300 mg total) by mouth 4 (four) times daily. X 7 days   IBUPROFEN (ADVIL,MOTRIN) 200 MG TABLET    Take 400 mg by mouth every 8 (eight) hours as needed for pain.   NORVIR 100 MG TABS TABLET    TAKE 1 TABLET (100 MG TOTAL) DAILY   OXYCODONE-ACETAMINOPHEN (PERCOCET) 5-325 MG PER TABLET    Take 2 tablets by mouth every 4 (four) hours as needed for pain.   OXYCODONE-ACETAMINOPHEN (PERCOCET/ROXICET) 5-325 MG PER TABLET    Take 1 tablet by mouth every 6 (six) hours as needed for pain.   PROPRANOLOL (INDERAL) 10 MG TABLET    TAKE 1 TABLET THREE TIMES A DAY   SILDENAFIL (VIAGRA) 100 MG TABLET    Take 1 tablet (100 mg total) by mouth as needed for erectile dysfunction.   TRUVADA 200-300 MG PER TABLET    TAKE 1 TABLET DAILY  Modified Medications   Modified Medication Previous Medication   ATAZANAVIR (REYATAZ) 300 MG CAPSULE REYATAZ 300 MG capsule      Take 1 capsule (300 mg total) by mouth daily with breakfast.    TAKE 1 CAPSULE DAILY  Discontinued Medications   No medications on file    Subjective: Adam Simon is in for his routine visit. He has not missed any doses of his Truvada, Reyataz and Norvir. 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 He is feeling well without any current complaints. He'll me know that his mother was recently hospitalized with postoperative lumbar infection and bacteremia due to MRSA but  was treated successfully and is now doing very well. Review of Systems: Pertinent items are noted in HPI.  Past Medical History  Diagnosis Date  . HIV (human immunodeficiency virus infection)     History  Substance Use Topics  . Smoking status: Never Smoker   . Smokeless tobacco: Never Used  . Alcohol Use: Yes     Comment: occ    No family history on file.  No Known Allergies  Objective: Temp: 98.6 F (37 C) (10/21 0925) Temp src: Oral (10/21 0925) BP: 125/81 mmHg (10/21 0925) Pulse Rate: 76 (10/21 0925)  General: He is in good spirits Oral: No oropharyngeal lesions Skin: No rash Lungs: Clear Cor: Regular S1-S2 no murmurs Mood and affect: Normal  Lab Results HIV 1 RNA Quant (copies/mL)  Date Value  01/21/2013 84*  06/19/2012 <20   12/07/2011 <20      CD4 T Cell Abs (/uL)  Date Value  01/21/2013 980   06/19/2012 1140   12/07/2011 1130      Assessment: He has had slight viral activation but has excellent adherence and I doubt that he has emerging resistance.  Plan: 1. Continue current antiretroviral regimen 2. Influenza vaccination today 3. Follow up after lab work in 6 months  Cliffton Asters, MD [facility] for Infectious Disease [facility] [phone] pager   [phone] cell 02/04/2013, 9:37 AM

## 2013-03-14 ENCOUNTER — Other Ambulatory Visit: Payer: Self-pay | Admitting: Internal Medicine

## 2013-03-27 ENCOUNTER — Other Ambulatory Visit: Payer: Self-pay | Admitting: Internal Medicine

## 2013-06-11 ENCOUNTER — Other Ambulatory Visit: Payer: Self-pay | Admitting: *Deleted

## 2013-06-11 DIAGNOSIS — B20 Human immunodeficiency virus [HIV] disease: Secondary | ICD-10-CM

## 2013-06-11 MED ORDER — RITONAVIR 100 MG PO TABS: ORAL_TABLET | ORAL | Status: DC

## 2013-06-12 ENCOUNTER — Other Ambulatory Visit: Payer: Self-pay | Admitting: Internal Medicine

## 2013-06-25 ENCOUNTER — Other Ambulatory Visit: Payer: Self-pay | Admitting: Internal Medicine

## 2013-08-05 ENCOUNTER — Other Ambulatory Visit: Payer: Managed Care, Other (non HMO)

## 2013-08-05 DIAGNOSIS — Z79899 Other long term (current) drug therapy: Secondary | ICD-10-CM

## 2013-08-05 DIAGNOSIS — B20 Human immunodeficiency virus [HIV] disease: Secondary | ICD-10-CM

## 2013-08-05 DIAGNOSIS — Z113 Encounter for screening for infections with a predominantly sexual mode of transmission: Secondary | ICD-10-CM

## 2013-08-05 LAB — COMPREHENSIVE METABOLIC PANEL
ALBUMIN: 4.2 g/dL (ref 3.5–5.2)
ALT: 110 U/L — AB (ref 0–53)
AST: 50 U/L — AB (ref 0–37)
Alkaline Phosphatase: 70 U/L (ref 39–117)
BUN: 17 mg/dL (ref 6–23)
CALCIUM: 8.9 mg/dL (ref 8.4–10.5)
CHLORIDE: 103 meq/L (ref 96–112)
CO2: 24 mEq/L (ref 19–32)
Creat: 0.78 mg/dL (ref 0.50–1.35)
Glucose, Bld: 122 mg/dL — ABNORMAL HIGH (ref 70–99)
POTASSIUM: 3.8 meq/L (ref 3.5–5.3)
Sodium: 137 mEq/L (ref 135–145)
Total Bilirubin: 2.1 mg/dL — ABNORMAL HIGH (ref 0.2–1.2)
Total Protein: 6.7 g/dL (ref 6.0–8.3)

## 2013-08-05 LAB — CBC
HEMATOCRIT: 37.4 % — AB (ref 39.0–52.0)
Hemoglobin: 13.4 g/dL (ref 13.0–17.0)
MCH: 33.9 pg (ref 26.0–34.0)
MCHC: 35.8 g/dL (ref 30.0–36.0)
MCV: 94.7 fL (ref 78.0–100.0)
Platelets: 199 10*3/uL (ref 150–400)
RBC: 3.95 MIL/uL — ABNORMAL LOW (ref 4.22–5.81)
RDW: 14.2 % (ref 11.5–15.5)
WBC: 5.9 10*3/uL (ref 4.0–10.5)

## 2013-08-05 LAB — RPR

## 2013-08-05 LAB — LIPID PANEL
Cholesterol: 174 mg/dL (ref 0–200)
HDL: 32 mg/dL — ABNORMAL LOW (ref >39)
LDL CALC: 85 mg/dL (ref 0–99)
TRIGLYCERIDES: 285 mg/dL — AB (ref <150)
Total CHOL/HDL Ratio: 5.4 Ratio
VLDL: 57 mg/dL — ABNORMAL HIGH (ref 0–40)

## 2013-08-06 LAB — T-HELPER CELL (CD4) - (RCID CLINIC ONLY)
CD4 % Helper T Cell: 36 % (ref 33–55)
CD4 T Cell Abs: 830 /uL (ref 400–2700)

## 2013-08-08 ENCOUNTER — Telehealth: Payer: Self-pay | Admitting: *Deleted

## 2013-08-08 ENCOUNTER — Other Ambulatory Visit: Payer: Self-pay | Admitting: *Deleted

## 2013-08-08 DIAGNOSIS — B20 Human immunodeficiency virus [HIV] disease: Secondary | ICD-10-CM

## 2013-08-08 LAB — HIV-1 RNA QUANT-NO REFLEX-BLD

## 2013-08-08 MED ORDER — ATAZANAVIR SULFATE 300 MG PO CAPS: 300.0000 mg | ORAL_CAPSULE | Freq: Every day | ORAL | Status: DC

## 2013-08-08 NOTE — Telephone Encounter (Signed)
Patient is going out of town and did not receive his mail order Rx for Reyataz. Requested a one week supply be sent to [facility], Rx called in. Wendall MolaJacqueline Damyra Luscher

## 2013-08-19 ENCOUNTER — Ambulatory Visit (INDEPENDENT_AMBULATORY_CARE_PROVIDER_SITE_OTHER): Payer: Managed Care, Other (non HMO) | Admitting: Internal Medicine

## 2013-08-19 ENCOUNTER — Encounter: Payer: Self-pay | Admitting: Internal Medicine

## 2013-08-19 VITALS — BP 145/95 | HR 92 | Temp 98.2°F | Wt 214.8 lb

## 2013-08-19 DIAGNOSIS — B20 Human immunodeficiency virus [HIV] disease: Secondary | ICD-10-CM

## 2013-08-19 DIAGNOSIS — A6000 Herpesviral infection of urogenital system, unspecified: Secondary | ICD-10-CM

## 2013-08-19 MED ORDER — ATAZANAVIR-COBICISTAT 300-150 MG PO TABS: 1.0000 | ORAL_TABLET | Freq: Every day | ORAL | Status: DC

## 2013-08-19 MED ORDER — VALACYCLOVIR HCL 500 MG PO TABS: ORAL_TABLET | ORAL | Status: DC

## 2013-08-19 NOTE — Progress Notes (Signed)
Patient ID: Adam IhaMichael Simon, male   DOB: [birth year], 58 y.o.   MRN: [id number]          Patient Active Problem List   Diagnosis Date Noted  . HIV DISEASE 09/06/2006    Priority: High  . Tremor, hereditary, benign 07/18/2012  . Dyslipidemia 12/28/2011  . GENITAL HERPES 09/06/2006  . HEPATITIS B, HX OF 09/06/2006    Patient's Medications  New Prescriptions   No medications on file  Previous Medications   ACYCLOVIR (ZOVIRAX) 400 MG TABLET    TAKE 1 TABLET DAILY   ATAZANAVIR (REYATAZ) 300 MG CAPSULE    Take 1 capsule (300 mg total) by mouth daily with breakfast.   CLINDAMYCIN (CLEOCIN) 300 MG CAPSULE    Take 1 capsule (300 mg total) by mouth 4 (four) times daily. X 7 days   IBUPROFEN (ADVIL,MOTRIN) 200 MG TABLET    Take 400 mg by mouth every 8 (eight) hours as needed for pain.   OXYCODONE-ACETAMINOPHEN (PERCOCET) 5-325 MG PER TABLET    Take 2 tablets by mouth every 4 (four) hours as needed for pain.   OXYCODONE-ACETAMINOPHEN (PERCOCET/ROXICET) 5-325 MG PER TABLET    Take 1 tablet by mouth every 6 (six) hours as needed for pain.   PROPRANOLOL (INDERAL) 10 MG TABLET    TAKE 1 TABLET THREE TIMES A DAY   RITONAVIR (NORVIR) 100 MG TABS TABLET    TAKE 1 TABLET (100 MG TOTAL) DAILY   SILDENAFIL (VIAGRA) 100 MG TABLET    Take 1 tablet (100 mg total) by mouth as needed for erectile dysfunction.   TRUVADA 200-300 MG PER TABLET    TAKE 1 TABLET DAILY  Modified Medications   No medications on file  Discontinued Medications   No medications on file    Subjective: [name] is in for his routine visit. He has had 2 occasions. He missed one day of one of his medications with his pharmacy with lites in the refills. He would take his other medications on those days. He is feeling well without complaints. He is currently [place] to [place] now and finds that schedule easier than the trans [employer] flights. He has been on once daily, suppressive acyclovir for many years. He has not had any  outbreaks of genital herpes during that time. Review of Systems: Pertinent items are noted in HPI.  Past Medical History  Diagnosis Date  . HIV (human immunodeficiency virus infection)     History  Substance Use Topics  . Smoking status: Never Smoker   . Smokeless tobacco: Never Used  . Alcohol Use: Yes     Comment: occ    No family history on file.  No Known Allergies  Objective:   There is no weight on file to calculate BMI.  General: He is well dressed and in good spirits Oral: No oropharyngeal lesions Skin: No rash Lungs: Clear Cor: Regular S1 and S2 no murmurs  Lab Results Lab Results  Component Value Date   WBC 5.9 08/05/2013   HGB 13.4 08/05/2013   HCT 37.4* 08/05/2013   MCV 94.7 08/05/2013   PLT 199 08/05/2013    Lab Results  Component Value Date   CREATININE 0.78 08/05/2013   BUN 17 08/05/2013   NA 137 08/05/2013   K 3.8 08/05/2013   CL 103 08/05/2013   CO2 24 08/05/2013    Lab Results  Component Value Date   ALT 110* 08/05/2013   AST 50* 08/05/2013   ALKPHOS 70 08/05/2013  BILITOT 2.1* 08/05/2013    Lab Results  Component Value Date   CHOL 174 08/05/2013   HDL 32* 08/05/2013   LDLCALC 85 08/05/2013   TRIG 285* 08/05/2013   CHOLHDL 5.4 08/05/2013    Lab Results HIV 1 RNA Quant (copies/mL)  Date Value  08/05/2013 <20   01/21/2013 84*  06/19/2012 <20      CD4 T Cell Abs (/uL)  Date Value  08/05/2013 830   01/21/2013 980   06/19/2012 1140      Assessment: His HIV infection remains under very good control. I asked him to call his pharmacy well in advance of needing refill so he does not have to miss. I also suggested that he review his medications as all are nontender. I will simplify his antiretroviral regimen to Truvada and Evotaz. I also suggested that he try intermittent, self-directed therapy with Valtrex for genital herpes outbreaks instead of lifelong, suppressive acyclovir.  Plan: 1. Change antiretroviral regimen to Truvada and Evotaz 2. Change  chronic suppressive acyclovir to intermittent, self-directed Valtrex 3. Followup after lab work in 6 months   Adam AstersJohn Zariah Cavendish, MD [facility] [phone] pager   [phone] cell 08/19/2013, 9:28 AM

## 2013-09-10 ENCOUNTER — Other Ambulatory Visit: Payer: Self-pay | Admitting: Internal Medicine

## 2013-09-16 ENCOUNTER — Other Ambulatory Visit: Payer: Self-pay | Admitting: Licensed Clinical Social Worker

## 2013-09-16 MED ORDER — EMTRICITABINE-TENOFOVIR DF 200-300 MG PO TABS: ORAL_TABLET | ORAL | Status: DC

## 2013-09-17 ENCOUNTER — Telehealth: Payer: Self-pay | Admitting: *Deleted

## 2013-09-17 DIAGNOSIS — B20 Human immunodeficiency virus [HIV] disease: Secondary | ICD-10-CM

## 2013-09-17 MED ORDER — EMTRICITABINE-TENOFOVIR DF 200-300 MG PO TABS: ORAL_TABLET | ORAL | Status: DC

## 2013-09-17 NOTE — Telephone Encounter (Signed)
Needing help with refill through [employer].  Needing 90-day rx for mail order pharmacy, Truvada.

## 2014-01-20 IMAGING — CT CT NECK W/ CM
3 of 4 series · 11 of 20 positions shown, 13 images · IV contrast (OMNIPAQUE 300)
Comparison: None.

CLINICAL DATA: 57-year-old male with neck and jaw swelling on the
left.

CT NECK WITH CONTRAST
TECHNIQUE: Multidetector CT imaging of the neck was performed with
intravenous contrast.
Contrast: 100mL OMNIPAQUE IOHEXOL 300 MG/ML  SOLN

[Series 4: neck with st · axial · 0.52mm/px · z∈[-538,-304]mm · 6 of 165 slices shown, 8 images]
[im 24/165  soft-tissue]
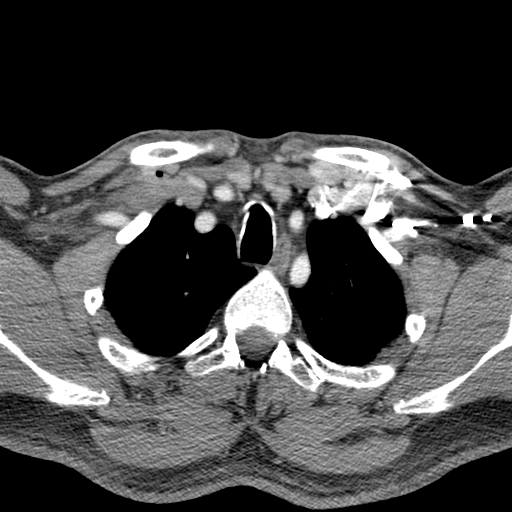
[im 24/165  bone]
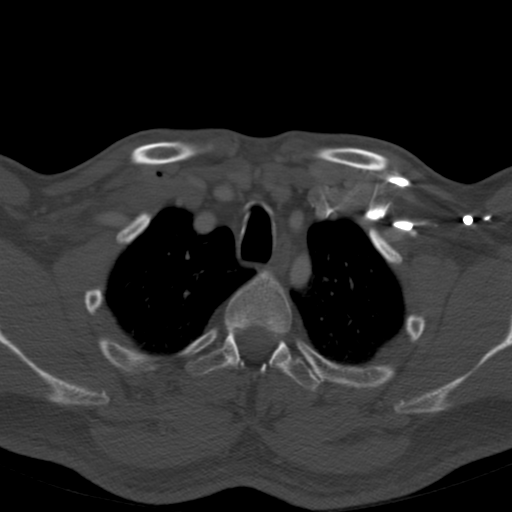
[im 47/165  bone]
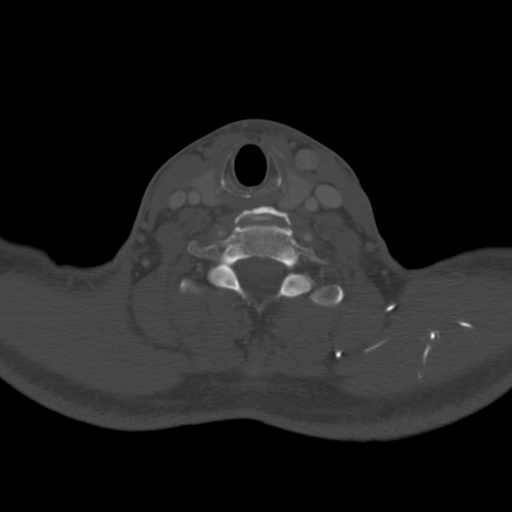
[im 71/165  bone]
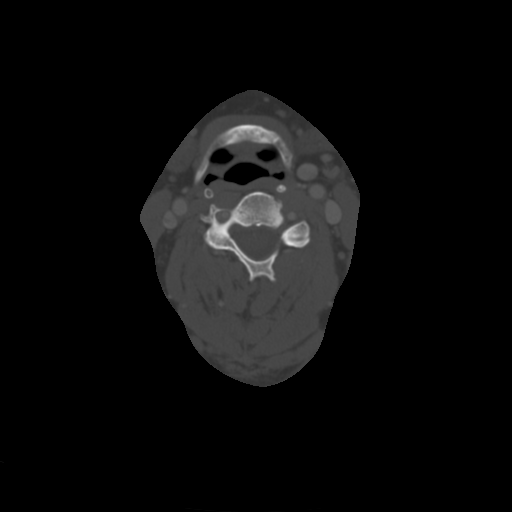
[im 94/165  bone]
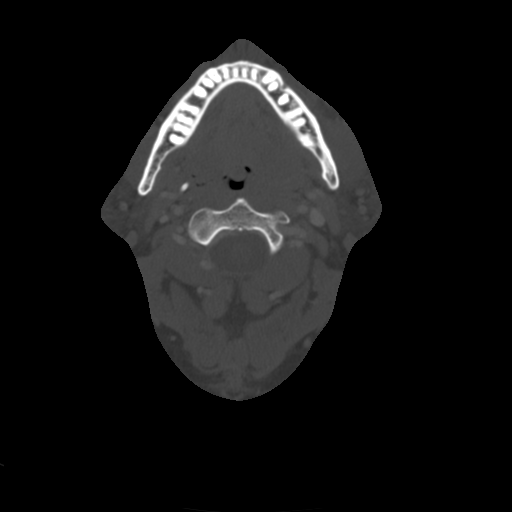
[im 118/165  soft-tissue]
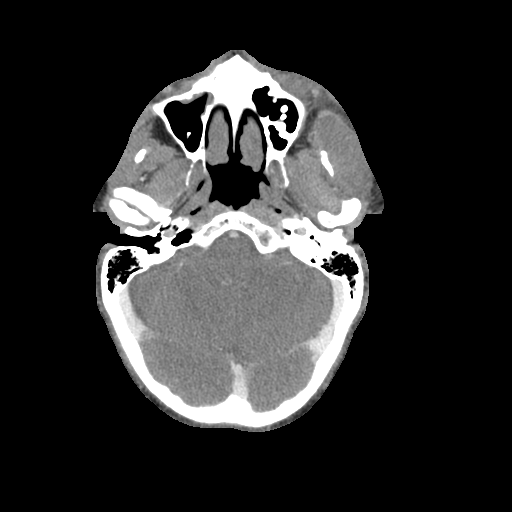
[im 118/165  bone]
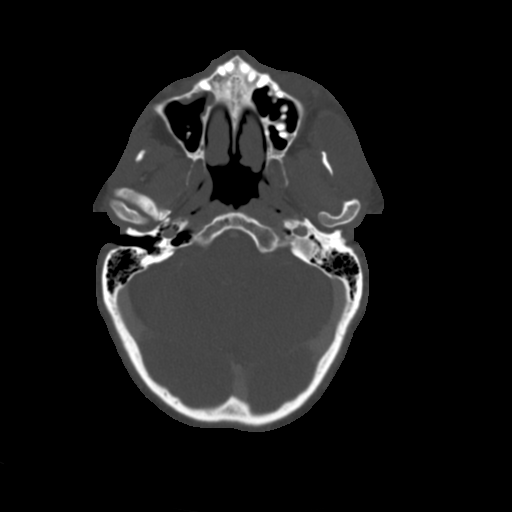
[im 141/165  bone]
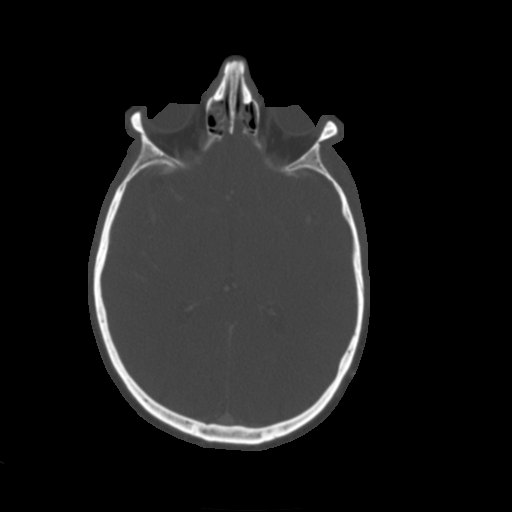

[Series 602: cor · coronal · 0.64mm/px · 3 of 70 slices shown]
[im 14/70  bone]
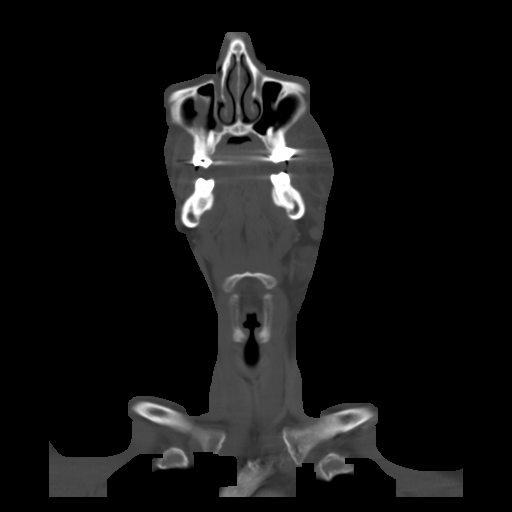
[im 28/70  bone]
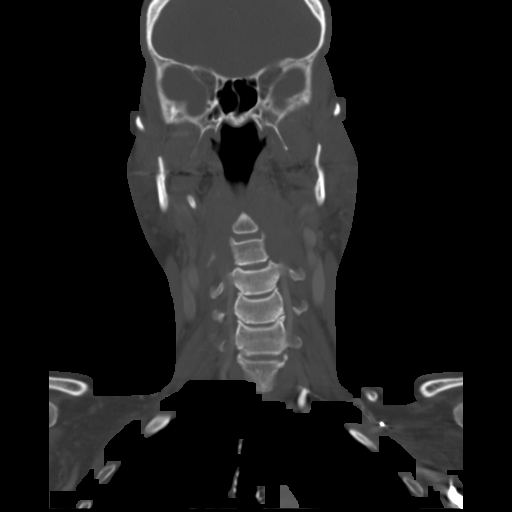
[im 56/70  bone]
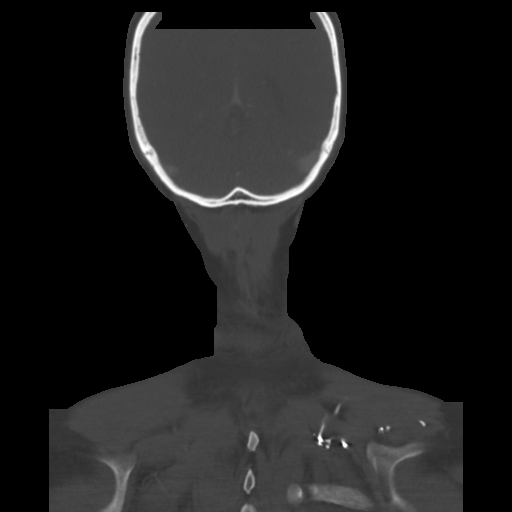

[Series 604: axial · axial · 0.64mm/px · z∈[-492,-404]mm · 2 of 92 slices shown]
[im 31/92  bone]
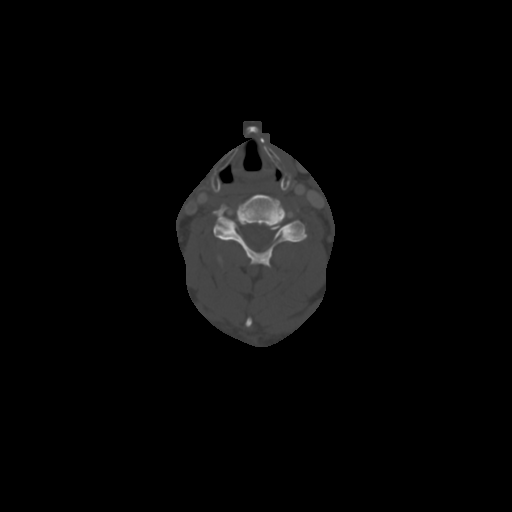
[im 61/92  bone]
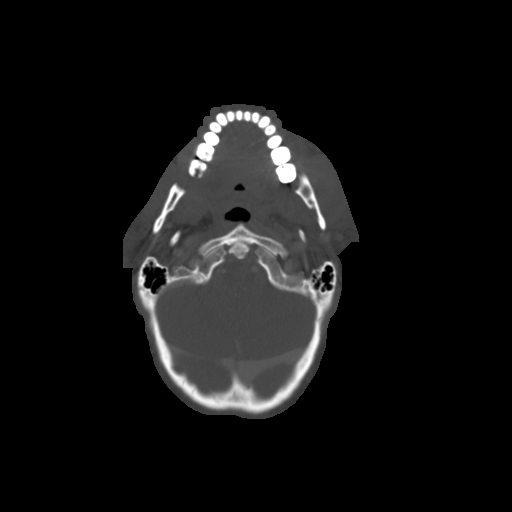

[11 of 20 positions shown; findings below may reference images not displayed]

FINDINGS: The left muscles of mastication are asymmetrically enlarged.  There
is heterogeneity at the medial left masseter muscle suspicious for
subperiosteal abscess adjacent the mandible (see coronal image 21).
There may be a subtle discontinuity along the outer cortex of the
posterior body of the mandible at the level (series 4 image 66).
There is a lucent mildly expansile lesion of the posterior body of
the mandible.  This is in continuity with the junction of the crown
and root of the left posterior mandible molar. There is osseous
discontinuity along the medial surface of the posterior body of the
mandible at the level (series 4 image 64).  The wisdom teeth are
absent.]

 There is mild asymmetric stranding in the left parapharyngeal
space (series 4 image 61).} there is left buccal space and premalar
subcutaneous stranding.

Negative retropharyngeal space.  Sublingual space within normal
limits.  Left submandibular space stranding.  Left submandibular
gland does not definitely appear inflamed. Small cervical lymph
nodes bilaterally.  No reactive or enlarged nodes identified.
Parotid glands appear within normal limits.

Small mucous retention cyst right maxillary sinus.  Mild ethmoid
sinus mucosal thickening.  Other Visualized paranasal sinuses and
mastoids are clear.  No other acute osseous abnormality identified.

The patient probably has had a cosmetic Chan implant, seen as
hyperdense area along the anterior lower mandible.

Negative lung apices.  No superior mediastinal
lymphadenopathy.Hypodense left thyroid nodule measuring 10 mm.
Negative larynx.  Hypopharynx within normal limits.  Oropharynx
within normal limits.

Visualized major vascular structures in the neck including the left
IJ and carotid are patent.

Negative visualized brain parenchyma.
IMPRESSION: 1.  Enlarged and heterogeneous left muscles of mastication with
evidence of a small 6 mm subperiosteal abscess in the left masseter
muscle along the lateral surface of the mandible (see coronal image
21).  See #2.
2.  Expansile lucent lesion in the posterior body of the left
mandible, with some continuity to the adjacent posterior left
mandible molar.  Evidence of medial and lateral cortical
breakthrough.
Favor this constellation reflects superinfection of an odontogenic
or dentigerous cyst, with secondary trans-spatial soft tissue
infection.
Less likely considerations include tooth infection with mandible
osteomyelitis (osseous changes appears more indolent than
aggressive), or neoplastic lytic mandible lesion with contiguous
spread from the bone.
3.  Associated cellulitis in the left face, left parapharyngeal
space, and submandibular space.  No other drainable fluid
collection.
4.  Left thyroid hypodense nodule.  Recommend further evaluation
with non emergent thyroid ultrasound.  If the patient is clinically
hyperthyroid, consider nuclear medicine thyroid scan.

## 2014-02-16 ENCOUNTER — Other Ambulatory Visit: Payer: Self-pay | Admitting: *Deleted

## 2014-02-16 DIAGNOSIS — R030 Elevated blood-pressure reading, without diagnosis of hypertension: Principal | ICD-10-CM

## 2014-02-16 DIAGNOSIS — IMO0001 Reserved for inherently not codable concepts without codable children: Secondary | ICD-10-CM

## 2014-02-16 MED ORDER — PROPRANOLOL HCL 10 MG PO TABS: ORAL_TABLET | ORAL | Status: DC

## 2014-02-16 NOTE — Telephone Encounter (Signed)
Requested pt call for 6 month f/u appt w/ MD

## 2014-02-19 ENCOUNTER — Ambulatory Visit (INDEPENDENT_AMBULATORY_CARE_PROVIDER_SITE_OTHER): Payer: Managed Care, Other (non HMO) | Admitting: Physician Assistant

## 2014-02-19 VITALS — BP 130/84 | HR 86 | Temp 98.7°F | Resp 16 | Ht 72.05 in | Wt 217.4 lb

## 2014-02-19 DIAGNOSIS — H6123 Impacted cerumen, bilateral: Secondary | ICD-10-CM

## 2014-02-19 NOTE — Progress Notes (Signed)
   Subjective:    Patient ID: Adam Simon, male    DOB: October 11, 1955, 58 y.o.   MRN: [id number]  Past Medical History  Diagnosis Date  . HIV (human immunodeficiency virus infection)     HPI  58 year old male with PMH listed above is here today because his ears feel stopped up.  This has been for 4 days.  He denies nasal congestion, fever, or chills.  He has no ear pain or ear discharge.  There is no loss of balance or dizziness.  He has had a hx of cerumen impaction, but was years ago.  He denies any trauma to the ear.  He does not use q-tips within the ear canals.    He [facility] in 3 weeks.     Review of Systems ROS otherwise normal unless listed above.      Objective:    Physical Exam  Constitutional: He is oriented to person, place, and time. He appears well-developed and well-nourished. No distress.  BP 130/84 mmHg  Pulse 86  Temp(Src) 98.7 F (37.1 C) (Oral)  Resp 16  Ht 6' 0.05" (1.83 m)  Wt 217 lb 6.4 oz (98.612 kg)  BMI 29.45 kg/m2  SpO2 97%   HENT:  Right Ear: External ear normal.  Left Ear: External ear normal.  Mouth/Throat: Uvula is midline and mucous membranes are normal. No uvula swelling. No oropharyngeal exudate, posterior oropharyngeal edema or posterior oropharyngeal erythema.  Right ear has cerumen impaction.  Can view 75% of the tympanic membrane which looks normal.  Left ear has generous cerumen impaction.  Unable to visualize TM with cerumen.  Eyes: Conjunctivae are normal. Pupils are equal, round, and reactive to light. Right eye exhibits no discharge. Left eye exhibits no discharge.  Pulmonary/Chest: Effort normal. No respiratory distress.  Neurological: He is alert and oriented to person, place, and time.  Skin: Skin is warm and dry.  Psychiatric: He has a normal mood and affect. His behavior is normal. Judgment and thought content normal.           Assessment & Plan:  58 [facility]  [facility] of 4 day cerumen impaction.     Cerumen impaction, bilateral Cerumen impaction cleaned out by nurse. Rechecked the ear and was able to fully visualize TM.  Hearing normal bilaterally.    -Patient will follow up with PCP.  He has declined the tdap and influenza vaccine, stating that he will be at his primary care in 3 weeks.    Trena PlattStephanie Ryenn Howeth, PA-C [facility] 11/5/20158:16 PM

## 2014-03-16 ENCOUNTER — Other Ambulatory Visit: Payer: Self-pay | Admitting: Internal Medicine

## 2014-03-16 ENCOUNTER — Other Ambulatory Visit: Payer: Self-pay | Admitting: *Deleted

## 2014-03-16 DIAGNOSIS — A6000 Herpesviral infection of urogenital system, unspecified: Secondary | ICD-10-CM

## 2014-03-16 MED ORDER — VALACYCLOVIR HCL 500 MG PO TABS: ORAL_TABLET | ORAL | Status: DC

## 2014-03-18 ENCOUNTER — Other Ambulatory Visit: Payer: Managed Care, Other (non HMO)

## 2014-03-18 DIAGNOSIS — B20 Human immunodeficiency virus [HIV] disease: Secondary | ICD-10-CM

## 2014-03-18 LAB — COMPREHENSIVE METABOLIC PANEL
ALBUMIN: 4.3 g/dL (ref 3.5–5.2)
ALT: 111 U/L — ABNORMAL HIGH (ref 0–53)
AST: 50 U/L — AB (ref 0–37)
Alkaline Phosphatase: 67 U/L (ref 39–117)
BUN: 17 mg/dL (ref 6–23)
CALCIUM: 9 mg/dL (ref 8.4–10.5)
CHLORIDE: 103 meq/L (ref 96–112)
CO2: 28 mEq/L (ref 19–32)
CREATININE: 0.89 mg/dL (ref 0.50–1.35)
Glucose, Bld: 87 mg/dL (ref 70–99)
POTASSIUM: 3.9 meq/L (ref 3.5–5.3)
Sodium: 139 mEq/L (ref 135–145)
TOTAL PROTEIN: 6.6 g/dL (ref 6.0–8.3)
Total Bilirubin: 2.6 mg/dL — ABNORMAL HIGH (ref 0.2–1.2)

## 2014-03-19 LAB — T-HELPER CELL (CD4) - (RCID CLINIC ONLY)
CD4 % Helper T Cell: 38 % (ref 33–55)
CD4 T Cell Abs: 1210 /uL (ref 400–2700)

## 2014-03-20 LAB — HIV-1 RNA QUANT-NO REFLEX-BLD: HIV 1 RNA Quant: <20 copies/mL (ref <20)

## 2014-04-01 ENCOUNTER — Encounter: Payer: Self-pay | Admitting: Internal Medicine

## 2014-04-01 ENCOUNTER — Ambulatory Visit (INDEPENDENT_AMBULATORY_CARE_PROVIDER_SITE_OTHER): Payer: Managed Care, Other (non HMO) | Admitting: Internal Medicine

## 2014-04-01 VITALS — BP 160/108 | HR 89 | Temp 98.5°F | Wt 220.5 lb

## 2014-04-01 DIAGNOSIS — B20 Human immunodeficiency virus [HIV] disease: Secondary | ICD-10-CM

## 2014-04-01 DIAGNOSIS — N529 Male erectile dysfunction, unspecified: Secondary | ICD-10-CM

## 2014-04-01 MED ORDER — SILDENAFIL CITRATE 100 MG PO TABS: 100.0000 mg | ORAL_TABLET | ORAL | Status: DC | PRN

## 2014-04-01 NOTE — Progress Notes (Signed)
Patient ID: Adam Simon, male   DOB: [birth year], 58 y.o.   MRN: [id number]          Patient Active Problem List   Diagnosis Date Noted  . Human immunodeficiency virus (HIV) disease 09/06/2006    Priority: High  . Tremor, hereditary, benign 07/18/2012  . Dyslipidemia 12/28/2011  . 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 HEPATITIS B, HX OF 09/06/2006    Patient's Medications  New Prescriptions   No medications on file  Previous Medications   ATAZANAVIR-COBICISTAT (EVOTAZ) 300-150 MG PER TABLET    Take 1 tablet by mouth daily. Swallow whole. Do NOT crush, cut or chew tablet. Take with food.   EMTRICITABINE-TENOFOVIR (TRUVADA) 200-300 MG PER TABLET    TAKE 1 TABLET DAILY   IBUPROFEN (ADVIL,MOTRIN) 200 MG TABLET    Take 400 mg by mouth every 8 (eight) hours as needed for pain.   PROPRANOLOL (INDERAL) 10 MG TABLET    TAKE 1 TABLET THREE TIMES A DAY   SILDENAFIL (VIAGRA) 100 MG TABLET    Take 1 tablet (100 mg total) by mouth as needed for erectile dysfunction.   VALACYCLOVIR (VALTREX) 500 MG TABLET    Take 1 tablet by mouth 2 times daily for 5 days as needed  Modified Medications   No medications on file  Discontinued Medications   No medications on file    Subjective: Adam Simon is in for his routine visit. He denies missing any doses of his Truvada or even it has since his last visit. He did try taking Valtrex only when he had an outbreak of labial herpes but after he stopped his daily suppressive dose of acyclovir he had 2 back-to-back episodes so he started taking his Valtrex every day and he's not had another outbreak since. He does not get any regular exercise. He does not have a primary care physician. He is feeling well. He would like to lose some weight. He has never had a screening colonoscopy.  Review of Systems: Constitutional: negative Eyes: negative Ears, nose, mouth, throat, and face: negative Respiratory: negative Cardiovascular: negative Gastrointestinal:  negative Genitourinary:negative  Past Medical History  Diagnosis Date  . HIV (human immunodeficiency virus infection)     History  Substance Use Topics  . Smoking status: Never Smoker   . Smokeless tobacco: Never Used  . Alcohol Use: Yes     Comment: occ    Family history: Hypertension in father and uncle  No Known Allergies  Objective: Temp: 98.5 F (36.9 C) (12/16 1428) Temp Source: Oral (12/16 1428) BP: 160/108 mmHg (12/16 1433) Pulse Rate: 89 (12/16 1433) Body mass index is 29.87 kg/(m^2).  General: He is in good spirits Oral: No oropharyngeal lesions Skin: No rashes Lungs: Clear Cor: Regular S1 and S2 with no murmur Abdomen: Obese, soft and nontender Joints and extremities: Normal Neuro: Alert with normal speech and conversation Mood: He is in good spirits  Lab Results Lab Results  Component Value Date   WBC 5.9 08/05/2013   HGB 13.4 08/05/2013   HCT 37.4* 08/05/2013   MCV 94.7 08/05/2013   PLT 199 08/05/2013    Lab Results  Component Value Date   CREATININE 0.89 03/18/2014   BUN 17 03/18/2014   NA 139 03/18/2014   K 3.9 03/18/2014   CL 103 03/18/2014   CO2 28 03/18/2014    Lab Results  Component Value Date   ALT 111* 03/18/2014   AST 50* 03/18/2014   ALKPHOS 67 03/18/2014   BILITOT 2.6* 03/18/2014  Lab Results  Component Value Date   CHOL 174 08/05/2013   HDL 32* 08/05/2013   LDLCALC 85 08/05/2013   TRIG 285* 08/05/2013   CHOLHDL 5.4 08/05/2013    Lab Results HIV 1 RNA QUANT (copies/mL)  Date Value  03/18/2014 <20  08/05/2013 <20  01/21/2013 84*   CD4 T CELL ABS (/uL)  Date Value  03/18/2014 1210  08/05/2013 830  01/21/2013 980     Assessment: His HIV infection is under excellent control. I will continue Truvada and Evotaz.  I talked to him about the importance of lifestyle modification. His blood pressure is up today. His weight has been creeping up gradually over the past 3-4 years. His liver enzymes are elevated and  he has some dyslipidemia. I encouraged him to try to get regular exercise. Also suggested that we help him establish primary care and he is in agreement.  Plan: 1. Continue current antiretroviral regimen 2. Influenza vaccination today 3. Lifestyle modification counseling provided 4. Establish primary care 5. Follow-up here after lab work in 6 months   Adam AstersJohn Joell Usman, MD [facility] [phone] pager   [phone] cell 04/01/2014, 2:55 PM

## 2014-04-07 ENCOUNTER — Other Ambulatory Visit: Payer: Self-pay | Admitting: *Deleted

## 2014-04-07 ENCOUNTER — Ambulatory Visit (INDEPENDENT_AMBULATORY_CARE_PROVIDER_SITE_OTHER): Payer: Managed Care, Other (non HMO) | Admitting: *Deleted

## 2014-04-07 DIAGNOSIS — Z23 Encounter for immunization: Secondary | ICD-10-CM

## 2014-04-07 DIAGNOSIS — N529 Male erectile dysfunction, unspecified: Secondary | ICD-10-CM

## 2014-04-07 MED ORDER — SILDENAFIL CITRATE 100 MG PO TABS: 100.0000 mg | ORAL_TABLET | ORAL | Status: DC | PRN

## 2014-08-18 ENCOUNTER — Other Ambulatory Visit: Payer: Self-pay | Admitting: *Deleted

## 2014-08-18 DIAGNOSIS — B20 Human immunodeficiency virus [HIV] disease: Secondary | ICD-10-CM

## 2014-08-18 MED ORDER — ATAZANAVIR-COBICISTAT 300-150 MG PO TABS: 1.0000 | ORAL_TABLET | Freq: Every day | ORAL | Status: DC

## 2014-08-19 ENCOUNTER — Telehealth: Payer: Self-pay | Admitting: *Deleted

## 2014-08-19 DIAGNOSIS — B20 Human immunodeficiency virus [HIV] disease: Secondary | ICD-10-CM

## 2014-08-19 MED ORDER — EMTRICITABINE-TENOFOVIR DF 200-300 MG PO TABS: ORAL_TABLET | ORAL | Status: DC

## 2014-08-19 NOTE — Telephone Encounter (Signed)
Pt has new mail order pharmacy.  HIV rxes sent to new pharmacy.

## 2014-09-17 ENCOUNTER — Other Ambulatory Visit: Payer: Managed Care, Other (non HMO)

## 2014-09-17 DIAGNOSIS — B20 Human immunodeficiency virus [HIV] disease: Secondary | ICD-10-CM

## 2014-09-17 LAB — CBC
HEMATOCRIT: 41.4 % (ref 39.0–52.0)
Hemoglobin: 14.5 g/dL (ref 13.0–17.0)
MCH: 34.4 pg — ABNORMAL HIGH (ref 26.0–34.0)
MCHC: 35 g/dL (ref 30.0–36.0)
MCV: 98.1 fL (ref 78.0–100.0)
MPV: 10.3 fL (ref 8.6–12.4)
Platelets: 220 10*3/uL (ref 150–400)
RBC: 4.22 MIL/uL (ref 4.22–5.81)
RDW: 14.2 % (ref 11.5–15.5)
WBC: 9.5 10*3/uL (ref 4.0–10.5)

## 2014-09-17 LAB — COMPREHENSIVE METABOLIC PANEL
ALK PHOS: 60 U/L (ref 39–117)
ALT: 154 U/L — ABNORMAL HIGH (ref 0–53)
AST: 72 U/L — ABNORMAL HIGH (ref 0–37)
Albumin: 4.4 g/dL (ref 3.5–5.2)
BUN: 18 mg/dL (ref 6–23)
CO2: 25 mEq/L (ref 19–32)
Calcium: 9 mg/dL (ref 8.4–10.5)
Chloride: 102 mEq/L (ref 96–112)
Creat: 0.8 mg/dL (ref 0.50–1.35)
GLUCOSE: 84 mg/dL (ref 70–99)
Potassium: 4.1 mEq/L (ref 3.5–5.3)
Sodium: 137 mEq/L (ref 135–145)
Total Bilirubin: 2.5 mg/dL — ABNORMAL HIGH (ref 0.2–1.2)
Total Protein: 6.8 g/dL (ref 6.0–8.3)

## 2014-09-17 LAB — LIPID PANEL
CHOL/HDL RATIO: 4.7 ratio
Cholesterol: 179 mg/dL (ref 0–200)
HDL: 38 mg/dL — ABNORMAL LOW (ref >=40)
LDL Cholesterol: 107 mg/dL — ABNORMAL HIGH (ref 0–99)
Triglycerides: 168 mg/dL — ABNORMAL HIGH (ref <150)
VLDL: 34 mg/dL (ref 0–40)

## 2014-09-18 LAB — T-HELPER CELL (CD4) - (RCID CLINIC ONLY)
CD4 % Helper T Cell: 35 % (ref 33–55)
CD4 T Cell Abs: 1250 /uL (ref 400–2700)

## 2014-09-18 LAB — RPR

## 2014-09-20 LAB — HIV-1 RNA QUANT-NO REFLEX-BLD
HIV 1 RNA Quant: <20 copies/mL (ref <20)
HIV-1 RNA Quant, Log: <1.3 {Log} (ref <1.30)

## 2014-09-25 ENCOUNTER — Other Ambulatory Visit: Payer: Self-pay | Admitting: Internal Medicine

## 2014-10-01 ENCOUNTER — Encounter: Payer: Self-pay | Admitting: Internal Medicine

## 2014-10-01 ENCOUNTER — Ambulatory Visit (INDEPENDENT_AMBULATORY_CARE_PROVIDER_SITE_OTHER): Payer: Managed Care, Other (non HMO) | Admitting: Internal Medicine

## 2014-10-01 VITALS — BP 141/89 | HR 87 | Temp 98.1°F | Wt 205.0 lb

## 2014-10-01 DIAGNOSIS — B20 Human immunodeficiency virus [HIV] disease: Secondary | ICD-10-CM

## 2014-10-01 DIAGNOSIS — N529 Male erectile dysfunction, unspecified: Secondary | ICD-10-CM

## 2014-10-01 MED ORDER — SILDENAFIL CITRATE 100 MG PO TABS: 100.0000 mg | ORAL_TABLET | ORAL | Status: DC | PRN

## 2014-10-01 NOTE — Progress Notes (Signed)
Patient ID: Adam Simon, male   DOB: 1955-05-17, 59 y.o.   MRN: [id number]          Patient Active Problem List   Diagnosis Date Noted  . Human immunodeficiency virus (HIV) disease 09/06/2006    Priority: High  . Tremor, hereditary, benign 07/18/2012  . Dyslipidemia 12/28/2011  . 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 HEPATITIS B, HX OF 09/06/2006    Patient's Medications  New Prescriptions   No medications on file  Previous Medications   ATAZANAVIR-COBICISTAT (EVOTAZ) 300-150 MG PER TABLET    Take 1 tablet by mouth daily. Swallow whole. Do NOT crush, cut or chew tablet. Take with food.   EMTRICITABINE-TENOFOVIR (TRUVADA) 200-300 MG PER TABLET    TAKE 1 TABLET DAILY   IBUPROFEN (ADVIL,MOTRIN) 200 MG TABLET    Take 400 mg by mouth every 8 (eight) hours as needed for pain.   PROPRANOLOL (INDERAL) 10 MG TABLET    TAKE 1 TABLET THREE TIMES A DAY   VALACYCLOVIR (VALTREX) 500 MG TABLET    Take 1 tablet by mouth 2 times daily for 5 days as needed  Modified Medications   Modified Medication Previous Medication   SILDENAFIL (VIAGRA) 100 MG TABLET sildenafil (VIAGRA) 100 MG tablet      Take 1 tablet (100 mg total) by mouth as needed for erectile dysfunction.    Take 1 tablet (100 mg total) by mouth as needed for erectile dysfunction.  Discontinued Medications   No medications on file    Subjective: Adam Simon is in for his routine HIV follow-up visit. As usual he never misses any doses of his Truvada or Evotaz. 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 He has been on a new diet. He takes an oral HCG supplement and restricts calories and fats. He started the diet 3 weeks ago and has lost 15 pounds. He is hoping that he can lose another 15 pounds in the next 3 weeks. He is feeling well without any current problems or concerns about his health. Review of Systems: Constitutional: negative Eyes: negative Ears, nose,  mouth, throat, and face: negative Respiratory: negative Cardiovascular: negative Gastrointestinal: negative Genitourinary:negative  Past Medical History  Diagnosis Date  . HIV (human immunodeficiency virus infection)     History  Substance Use Topics  . Smoking status: Never Smoker   . Smokeless tobacco: Never Used  . Alcohol Use: Yes     Comment: occ    No family history on file.  No Known Allergies  Objective: Temp: 98.1 F (36.7 C) (06/16 1349) Temp Source: Oral (06/16 1349) BP: 141/89 mmHg (06/16 1349) Pulse Rate: 87 (06/16 1349) Body mass index is 27.77 kg/(m^2).  General: His weight is down 15 pounds to 205 Oral: No oropharyngeal lesions and his teeth are in good condition Skin: No rash Lungs: Clear Cor: Regular S1 and S2 with no murmur Mood: He is in good spirits. He does not appear anxious or depressed  Lab Results Lab Results  Component Value Date   WBC 9.5 09/17/2014   HGB 14.5 09/17/2014   HCT 41.4 09/17/2014   MCV 98.1 09/17/2014   PLT 220 09/17/2014    Lab Results  Component Value Date   CREATININE 0.80 09/17/2014   BUN 18 09/17/2014   NA 137 09/17/2014   K 4.1 09/17/2014   CL 102 09/17/2014   CO2 25 09/17/2014    Lab Results  Component Value Date  ALT 154* 09/17/2014   AST 72* 09/17/2014   ALKPHOS 60 09/17/2014   BILITOT 2.5* 09/17/2014    Lab Results  Component Value Date   CHOL 179 09/17/2014   HDL 38* 09/17/2014   LDLCALC 107* 09/17/2014   TRIG 168* 09/17/2014   CHOLHDL 4.7 09/17/2014    Lab Results HIV 1 RNA QUANT (copies/mL)  Date Value  09/17/2014 <20  03/18/2014 <20  08/05/2013 <20   CD4 T CELL ABS (/uL)  Date Value  09/17/2014 1250  03/18/2014 1210  08/05/2013 830     Assessment: His HIV infection remains under excellent control.  He has been able to lose weight. His BMI is improving and his blood pressure is down. Overall his lipids are improving as well. He does have some elevations of his liver  transaminases. He normally drinks alcohol socially but does not drink alcohol while on his current diet. He may also have some component of fatty liver.  Plan: 1. Continue Truvada and Evotaz 2. Encouraged continued lifestyle modification in a way that he will be able to sustain his weight loss 3. Follow-up after lab work in 6 months   Cliffton Asters, MD [facility] for Infectious Disease [facility] [phone] pager   [phone] cell 10/01/2014, 2:05 PM

## 2014-10-17 ENCOUNTER — Ambulatory Visit (INDEPENDENT_AMBULATORY_CARE_PROVIDER_SITE_OTHER): Payer: Managed Care, Other (non HMO) | Admitting: Emergency Medicine

## 2014-10-17 VITALS — BP 140/74 | HR 78 | Temp 98.0°F | Resp 18 | Ht 73.0 in | Wt 195.0 lb

## 2014-10-17 DIAGNOSIS — Z1211 Encounter for screening for malignant neoplasm of colon: Secondary | ICD-10-CM

## 2014-10-17 DIAGNOSIS — Z202 Contact with and (suspected) exposure to infections with a predominantly sexual mode of transmission: Secondary | ICD-10-CM

## 2014-10-17 DIAGNOSIS — A084 Viral intestinal infection, unspecified: Secondary | ICD-10-CM | POA: Diagnosis not present

## 2014-10-17 LAB — COMPREHENSIVE METABOLIC PANEL
ALK PHOS: 64 U/L (ref 39–117)
ALT: 45 U/L (ref 0–53)
AST: 27 U/L (ref 0–37)
Albumin: 4.6 g/dL (ref 3.5–5.2)
BUN: 15 mg/dL (ref 6–23)
CHLORIDE: 103 meq/L (ref 96–112)
CO2: 26 mEq/L (ref 19–32)
Calcium: 9.4 mg/dL (ref 8.4–10.5)
Creat: 0.89 mg/dL (ref 0.50–1.35)
GLUCOSE: 95 mg/dL (ref 70–99)
Potassium: 4.3 mEq/L (ref 3.5–5.3)
SODIUM: 139 meq/L (ref 135–145)
TOTAL PROTEIN: 7.1 g/dL (ref 6.0–8.3)
Total Bilirubin: 1.9 mg/dL — ABNORMAL HIGH (ref 0.2–1.2)

## 2014-10-17 LAB — LIPID PANEL
CHOLESTEROL: 110 mg/dL (ref 0–200)
HDL: 37 mg/dL — ABNORMAL LOW (ref >=40)
LDL CALC: 57 mg/dL (ref 0–99)
Total CHOL/HDL Ratio: 3 Ratio
Triglycerides: 78 mg/dL (ref <150)
VLDL: 16 mg/dL (ref 0–40)

## 2014-10-17 LAB — CBC
HCT: 42.9 % (ref 39.0–52.0)
Hemoglobin: 15.1 g/dL (ref 13.0–17.0)
MCH: 35 pg — AB (ref 26.0–34.0)
MCHC: 35.2 g/dL (ref 30.0–36.0)
MCV: 99.5 fL (ref 78.0–100.0)
MPV: 11.6 fL (ref 8.6–12.4)
Platelets: 229 10*3/uL (ref 150–400)
RBC: 4.31 MIL/uL (ref 4.22–5.81)
RDW: 13.6 % (ref 11.5–15.5)
WBC: 7.2 10*3/uL (ref 4.0–10.5)

## 2014-10-17 LAB — RPR

## 2014-10-17 NOTE — Patient Instructions (Signed)
Colonoscopy  A colonoscopy is an exam to look at the entire large intestine (colon). This exam can help find problems such as tumors, polyps, inflammation, and areas of bleeding. The exam takes about 1 hour.   LET YOUR HEALTH CARE PROVIDER KNOW ABOUT:   · Any allergies you have.  · All medicines you are taking, including vitamins, herbs, eye drops, creams, and over-the-counter medicines.  · Previous problems you or members of your family have had with the use of anesthetics.  · Any blood disorders you have.  · Previous surgeries you have had.  · Medical conditions you have.  RISKS AND COMPLICATIONS   Generally, this is a safe procedure. However, as with any procedure, complications can occur. Possible complications include:  · Bleeding.  · Tearing or rupture of the colon wall.  · Reaction to medicines given during the exam.  · Infection (rare).  BEFORE THE PROCEDURE   · Ask your health care provider about changing or stopping your regular medicines.  · You may be prescribed an oral bowel prep. This involves drinking a large amount of medicated liquid, starting the day before your procedure. The liquid will cause you to have multiple loose stools until your stool is almost clear or light green. This cleans out your colon in preparation for the procedure.  · Do not eat or drink anything else once you have started the bowel prep, unless your health care provider tells you it is safe to do so.  · Arrange for someone to drive you home after the procedure.  PROCEDURE   · You will be given medicine to help you relax (sedative).  · You will lie on your side with your knees bent.  · A long, flexible tube with a light and camera on the end (colonoscope) will be inserted through the rectum and into the colon. The camera sends video back to a computer screen as it moves through the colon. The colonoscope also releases carbon dioxide gas to inflate the colon. This helps your health care provider see the area better.  · During  the exam, your health care provider may take a small tissue sample (biopsy) to be examined under a microscope if any abnormalities are found.  · The exam is finished when the entire colon has been viewed.  AFTER THE PROCEDURE   · Do not drive for 24 hours after the exam.  · You may have a small amount of blood in your stool.  · You may pass moderate amounts of gas and have mild abdominal cramping or bloating. This is caused by the gas used to inflate your colon during the exam.  · Ask when your test results will be ready and how you will get your results. Make sure you get your test results.  Document Released: 03/31/2000 Document Revised: 01/22/2013 Document Reviewed: 12/09/2012  ExitCare® Patient Information ©2015 [employer]. This information is not intended to replace advice given to you by your health care provider. Make sure you discuss any questions you have with your health care provider.

## 2014-10-17 NOTE — Progress Notes (Signed)
Subjective:  Patient ID: Adam Simon, male    DOB: 07/13/1955  Age: 59 y.o. MRN: [id number]  CC: Diarrhea and Other   HPI Adam Simon presents  diarrhea. He is a Biochemist, clinicalcabinet attendant with [employer] and has been ill for so since Thursday with diarrhea Said no abdominal pain. n he's had moderate improvement with over-the-counter Imodium. As it's a weekend with the holidays required to get a pain medical clearance to stay home from work. [place] on 11 hour flight. He also requests STD screening all of these currently not symptomatic. He's not had any routine medical care and has been referred to 104.   History Adam Simon has a past medical history of HIV (human immunodeficiency virus infection).   He has past surgical history that includes Tumor removal.   His  family history is not on file.  He   reports that he has never smoked. He has never used smokeless tobacco. He reports that he drinks alcohol. He reports that he does not use illicit drugs.  Outpatient Prescriptions Prior to Visit  Medication Sig Dispense Refill  . atazanavir-cobicistat (EVOTAZ) 300-150 MG per tablet Take 1 tablet by mouth daily. Swallow whole. Do NOT crush, cut or chew tablet. Take with food. 90 tablet 0  . emtricitabine-tenofovir (TRUVADA) 200-300 MG per tablet TAKE 1 TABLET DAILY 90 tablet 3  . propranolol (INDERAL) 10 MG tablet TAKE 1 TABLET THREE TIMES A DAY 270 tablet 3  . sildenafil (VIAGRA) 100 MG tablet Take 1 tablet (100 mg total) by mouth as needed for erectile dysfunction. 20 tablet 2  . valACYclovir (VALTREX) 500 MG tablet Take 1 tablet by mouth 2 times daily for 5 days as needed 60 tablet 3  . ibuprofen (ADVIL,MOTRIN) 200 MG tablet Take 400 mg by mouth every 8 (eight) hours as needed for pain.     No facility-administered medications prior to visit.    History   Social History  . Marital Status: Married    Spouse Name: N/A  . Number of  Children: N/A  . Years of Education: N/A   Social History Main Topics  . Smoking status: Never Smoker   . Smokeless tobacco: Never Used  . Alcohol Use: Yes     Comment: occ  . Drug Use: No  . Sexual Activity:    Partners: Male     Comment: decined condoms   Other Topics Concern  . None   Social History Narrative     Review of Systems  Constitutional: Negative for fever, chills and appetite change.  HENT: Negative for congestion, ear pain, postnasal drip, sinus pressure and sore throat.   Eyes: Negative for pain and redness.  Respiratory: Negative for cough, shortness of breath and wheezing.   Cardiovascular: Negative for leg swelling.  Gastrointestinal: Positive for diarrhea. Negative for nausea, vomiting, abdominal pain, constipation and blood in stool.  Endocrine: Negative for polyuria.  Genitourinary: Negative for dysuria, urgency, frequency and flank pain.  Musculoskeletal: Negative for gait problem.  Skin: Negative for rash.  Neurological: Negative for weakness and headaches.  Psychiatric/Behavioral: Negative for confusion and decreased concentration. The patient is not nervous/anxious.     Objective:  BP 140/74 mmHg  Pulse 78  Temp(Src) 98 F (36.7 C)  Resp 18  Ht 6\' 1"  (1.854 m)  Wt 195 lb (88.451 kg)  BMI 25.73 kg/m2  SpO2 98%  Physical Exam  Constitutional: He is oriented to person, place, and time. He  appears well-developed and well-nourished.  HENT:  Head: Normocephalic and atraumatic.  Eyes: Conjunctivae are normal. Pupils are equal, round, and reactive to light.  Neck: Normal range of motion. Neck supple.  Cardiovascular: Normal rate.   Pulmonary/Chest: Effort normal.  Abdominal: Soft. Bowel sounds are normal. There is no tenderness.  Musculoskeletal: He exhibits no edema.  Neurological: He is alert and oriented to person, place, and time.  Skin: Skin is warm and dry.  Psychiatric: He has a normal mood and affect. His behavior is normal. Thought  content normal.      Assessment & Plan:   Adam Simon was seen today for diarrhea and other.  Diagnoses and all orders for this visit:  Viral gastroenteritis Orders: -     CBC -     Comprehensive metabolic panel -     GC/Chlamydia Probe Amp -     Lipid panel -     PSA -     RPR -     HSV(herpes simplex vrs) 1+2 ab-IgG  Colon cancer screening Orders: -     Ambulatory referral to Gastroenterology -     CBC -     Comprehensive metabolic panel -     GC/Chlamydia Probe Amp -     Lipid panel -     PSA -     RPR -     HSV(herpes simplex vrs) 1+2 ab-IgG  STD exposure Orders: -     CBC -     Comprehensive metabolic panel -     GC/Chlamydia Probe Amp -     Lipid panel -     PSA -     RPR -     HSV(herpes simplex vrs) 1+2 ab-IgG   I have discontinued [name] ibuprofen. I am also having him maintain his propranolol, valACYclovir, atazanavir-cobicistat, emtricitabine-tenofovir, and sildenafil.  No orders of the defined types were placed in this encounter.   Clinically he appears a little dehydrated suggested he increase fluid intake and arrange for him to have a colonoscopy and referral to 104  Appropriate red flag conditions were discussed with the patient as well as actions that should be taken.  Patient expressed his understanding.  Follow-up: Return if symptoms worsen or fail to improve.  Carmelina Dane, MD

## 2014-10-19 LAB — PSA: PSA: 0.17 ng/mL (ref <=4.00)

## 2014-10-20 ENCOUNTER — Other Ambulatory Visit: Payer: Self-pay | Admitting: *Deleted

## 2014-10-20 DIAGNOSIS — A6000 Herpesviral infection of urogenital system, unspecified: Secondary | ICD-10-CM

## 2014-10-20 LAB — HSV(HERPES SIMPLEX VRS) I + II AB-IGG
HSV 1 GLYCOPROTEIN G AB, IGG: 1.75 IV — AB
HSV 2 Glycoprotein G Ab, IgG: 0.52 IV

## 2014-10-20 MED ORDER — VALACYCLOVIR HCL 500 MG PO TABS: ORAL_TABLET | ORAL | Status: DC

## 2014-10-21 ENCOUNTER — Other Ambulatory Visit: Payer: Self-pay | Admitting: *Deleted

## 2014-10-21 ENCOUNTER — Encounter: Payer: Self-pay | Admitting: Internal Medicine

## 2014-10-21 DIAGNOSIS — B20 Human immunodeficiency virus [HIV] disease: Secondary | ICD-10-CM

## 2014-10-21 LAB — GC/CHLAMYDIA PROBE AMP
CT Probe RNA: NEGATIVE
GC Probe RNA: NEGATIVE

## 2014-10-21 MED ORDER — ATAZANAVIR-COBICISTAT 300-150 MG PO TABS: 1.0000 | ORAL_TABLET | Freq: Every day | ORAL | Status: DC

## 2014-10-28 ENCOUNTER — Telehealth: Payer: Self-pay | Admitting: *Deleted

## 2014-10-28 ENCOUNTER — Other Ambulatory Visit: Payer: Self-pay | Admitting: *Deleted

## 2014-10-28 DIAGNOSIS — N529 Male erectile dysfunction, unspecified: Secondary | ICD-10-CM

## 2014-10-28 DIAGNOSIS — A6000 Herpesviral infection of urogenital system, unspecified: Secondary | ICD-10-CM

## 2014-10-28 MED ORDER — VALACYCLOVIR HCL 500 MG PO TABS: ORAL_TABLET | ORAL | Status: DC

## 2014-10-28 NOTE — Telephone Encounter (Signed)
Pt's insurance does not cover Viagra, only Cialis.  MD please advise regarding changing medication.

## 2014-10-28 NOTE — Telephone Encounter (Signed)
If he prefers to try Cialis please change him to Cialis 10 mg orally as needed. He can have 30 tablets with 3 refills.

## 2014-10-29 MED ORDER — TADALAFIL 10 MG PO TABS: 10.0000 mg | ORAL_TABLET | Freq: Every day | ORAL | Status: DC | PRN

## 2014-10-29 NOTE — Addendum Note (Signed)
Addended by: Jennet MaduroESTRIDGE, DENISE D on: 10/29/2014 03:40 PM   Modules accepted: Orders, Medications

## 2014-12-08 ENCOUNTER — Ambulatory Visit (AMBULATORY_SURGERY_CENTER): Payer: Self-pay

## 2014-12-08 ENCOUNTER — Encounter: Payer: Self-pay | Admitting: Family Medicine

## 2014-12-08 ENCOUNTER — Ambulatory Visit (INDEPENDENT_AMBULATORY_CARE_PROVIDER_SITE_OTHER): Payer: Managed Care, Other (non HMO) | Admitting: Emergency Medicine

## 2014-12-08 VITALS — BP 136/82 | HR 81 | Temp 98.3°F | Resp 16 | Ht 73.0 in | Wt 180.6 lb

## 2014-12-08 VITALS — Ht 73.0 in | Wt 182.6 lb

## 2014-12-08 DIAGNOSIS — Z1211 Encounter for screening for malignant neoplasm of colon: Secondary | ICD-10-CM

## 2014-12-08 DIAGNOSIS — L989 Disorder of the skin and subcutaneous tissue, unspecified: Secondary | ICD-10-CM

## 2014-12-08 DIAGNOSIS — Z0000 Encounter for general adult medical examination without abnormal findings: Secondary | ICD-10-CM | POA: Diagnosis not present

## 2014-12-08 DIAGNOSIS — Z23 Encounter for immunization: Secondary | ICD-10-CM

## 2014-12-08 NOTE — Progress Notes (Signed)
Subjective:    Patient ID: Adam Simon, male    DOB: 1955/09/09, 59 y.o.   MRN: [id number]  HPI This is a very pleasant 59 yo male who presents today for CPE. He is an Insurance claims handler. He has recently done a supervised weight loss program and lost 25 pounds.  Last CPE- unknown, labs within last two months unremarkable- CMET, CBC, lipid panel PSA- 7/16 normal Colonoscopy- he is scheduled for screening Tdap- 2008 Flu- today Dental- regular Eye- not regular Exercise- regular, walks  His only complaint today is a lesion on his penis at his "circumcision scar." He first noticed it about 7 weeks ago. He was seen [facility] walk in 7/2/16and was tested for STDs. 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 The lesion has gotten a little bit smaller, but has not resolved completely. He has applied neosporin, H2O2 without relief. No pain, no drainage.    He is HIV positive and sees Dr. Cliffton Asters for medication management. He denies any side effects of his medications. CD4 T cell count 1250 6/16.   Past Medical History  Diagnosis Date  . HIV (human immunodeficiency virus infection)   . Sleep apnea    Past Surgical History  Procedure Laterality Date  . Tumor removal      left side jaw  . Cosmetic surgery     No family history on file. Social History  Substance Use Topics  . Smoking status: Never Smoker   . Smokeless tobacco: Never Used  . Alcohol Use: 1.2 oz/week    2 Standard drinks or equivalent per week     Comment: 2 per week    Review of Systems  Constitutional: Negative.   HENT: Negative.   Eyes: Negative.   Respiratory: Negative.   Cardiovascular: Negative.   Gastrointestinal: Negative.   Endocrine: Negative.   Genitourinary: Negative.   Musculoskeletal: Negative.   Skin: Negative.   Allergic/Immunologic: Negative.   Neurological: Negative.   Hematological: Negative.   Psychiatric/Behavioral: Negative.         Objective:   Physical Exam  Genitourinary:      Physical Exam  Constitutional: He is oriented to person, place, and time. He appears well-developed and well-nourished.  HENT:  Head: Normocephalic and atraumatic.  Right Ear: External ear normal.  Left Ear: External ear normal.  Nose: Nose normal.  Mouth/Throat: Oropharynx is clear and moist.  Eyes: Conjunctivae are normal. Pupils are equal, round, and reactive to light.  Neck: Normal range of motion. Neck supple.  Cardiovascular: Normal rate, regular rhythm, normal heart sounds and intact distal pulses.   Pulmonary/Chest: Effort normal and breath sounds normal.  Abdominal: Soft. Bowel sounds are normal. Hernia confirmed negative in the right inguinal area and confirmed negative in the left inguinal area.  Genitourinary: Testes normal. Circumcised.  Musculoskeletal: Normal range of motion. He exhibits no edema or tenderness.       Cervical back: Normal.       Thoracic back: Normal.       Lumbar back: Normal.  Lymphadenopathy:    He has no cervical adenopathy.       Right: No inguinal adenopathy present.       Left: No inguinal adenopathy present.  Neurological: He is alert and oriented to person, place, and time. He has normal reflexes.  Skin: Skin is warm and dry.  Psychiatric: He has a normal mood and affect. His behavior is normal. Judgment normal.  Vitals reviewed.  BP 136/82 mmHg  Pulse 81  Temp(Src) 98.3 F (36.8 C) (Oral)  Resp 16  Ht 6\' 1"  (1.854 m)  Wt 180 lb 9.6 oz (81.92 kg)  BMI 23.83 kg/m2 Wt Readings from Last 3 Encounters:  12/08/14 180 lb 9.6 oz (81.92 kg)  10/17/14 195 lb (88.451 kg)  10/01/14 205 lb (92.987 kg)      Assessment & Plan:  1. Annual physical exam - recent labs reviewed with patient   2. Need for prophylactic vaccination and inoculation against influenza - Flu Vaccine QUAD 36+ mos IM  3. Skin lesion on examination - appears cystic, amb ref to dermatology   Olean Ree,  FNP-BC  [facility]  12/08/2014 9:38 PM

## 2014-12-08 NOTE — Patient Instructions (Signed)
Go ahead and schedule an appointment with Dr. Emily Filbert.  I will send in a referral to hopefully expedite your visit.

## 2014-12-08 NOTE — Progress Notes (Signed)
Per pt, no allergies to soy or egg products.Pt not taking any weight loss meds or using  O2 at home. 

## 2014-12-11 ENCOUNTER — Encounter: Payer: Self-pay | Admitting: Internal Medicine

## 2014-12-22 ENCOUNTER — Encounter: Payer: Managed Care, Other (non HMO) | Admitting: Internal Medicine

## 2015-03-24 ENCOUNTER — Other Ambulatory Visit: Payer: Managed Care, Other (non HMO)

## 2015-03-24 DIAGNOSIS — B20 Human immunodeficiency virus [HIV] disease: Secondary | ICD-10-CM

## 2015-03-24 LAB — COMPREHENSIVE METABOLIC PANEL
ALBUMIN: 4.3 g/dL (ref 3.6–5.1)
ALT: 27 U/L (ref 9–46)
AST: 25 U/L (ref 10–35)
Alkaline Phosphatase: 56 U/L (ref 40–115)
BUN: 20 mg/dL (ref 7–25)
CO2: 23 mmol/L (ref 20–31)
Calcium: 9.3 mg/dL (ref 8.6–10.3)
Chloride: 101 mmol/L (ref 98–110)
Creat: 0.82 mg/dL (ref 0.70–1.33)
Glucose, Bld: 91 mg/dL (ref 65–99)
POTASSIUM: 4.3 mmol/L (ref 3.5–5.3)
Sodium: 137 mmol/L (ref 135–146)
TOTAL PROTEIN: 7.2 g/dL (ref 6.1–8.1)
Total Bilirubin: 1.6 mg/dL — ABNORMAL HIGH (ref 0.2–1.2)

## 2015-03-24 LAB — CBC
HCT: 42.9 % (ref 39.0–52.0)
HEMOGLOBIN: 14.7 g/dL (ref 13.0–17.0)
MCH: 34.8 pg — AB (ref 26.0–34.0)
MCHC: 34.3 g/dL (ref 30.0–36.0)
MCV: 101.7 fL — AB (ref 78.0–100.0)
MPV: 10.8 fL (ref 8.6–12.4)
Platelets: 205 10*3/uL (ref 150–400)
RBC: 4.22 MIL/uL (ref 4.22–5.81)
RDW: 13.9 % (ref 11.5–15.5)
WBC: 8.1 10*3/uL (ref 4.0–10.5)

## 2015-03-24 LAB — LIPID PANEL
CHOL/HDL RATIO: 4.9 ratio (ref <=5.0)
CHOLESTEROL: 186 mg/dL (ref 125–200)
HDL: 38 mg/dL — AB (ref >=40)
LDL Cholesterol: 107 mg/dL (ref <130)
Triglycerides: 206 mg/dL — ABNORMAL HIGH (ref <150)
VLDL: 41 mg/dL — AB (ref <30)

## 2015-03-25 LAB — T-HELPER CELL (CD4) - (RCID CLINIC ONLY)
CD4 T CELL HELPER: 35 % (ref 33–55)
CD4 T Cell Abs: 1180 /uL (ref 400–2700)

## 2015-03-25 LAB — HIV-1 RNA QUANT-NO REFLEX-BLD: HIV-1 RNA Quant, Log: <1.3 Log copies/mL (ref <1.30)

## 2015-03-25 LAB — RPR

## 2015-04-07 ENCOUNTER — Ambulatory Visit (INDEPENDENT_AMBULATORY_CARE_PROVIDER_SITE_OTHER): Payer: Managed Care, Other (non HMO) | Admitting: Internal Medicine

## 2015-04-07 ENCOUNTER — Encounter: Payer: Self-pay | Admitting: Internal Medicine

## 2015-04-07 DIAGNOSIS — B20 Human immunodeficiency virus [HIV] disease: Secondary | ICD-10-CM

## 2015-04-07 MED ORDER — EMTRICITABINE-TENOFOVIR AF 200-25 MG PO TABS: 1.0000 | ORAL_TABLET | Freq: Every day | ORAL | Status: DC

## 2015-04-07 NOTE — Progress Notes (Signed)
Patient Active Problem List   Diagnosis Date Noted  . Human immunodeficiency virus (HIV) disease 09/06/2006    Priority: High  . Tremor, hereditary, benign 07/18/2012  . Dyslipidemia 12/28/2011  . 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 HEPATITIS B, HX OF 09/06/2006    Patient's Medications  New Prescriptions   EMTRICITABINE-TENOFOVIR AF (DESCOVY) 200-25 MG TABLET    Take 1 tablet by mouth daily.  Previous Medications   ATAZANAVIR-COBICISTAT (EVOTAZ) 300-150 MG PER TABLET    Take 1 tablet by mouth daily. Swallow whole. Do NOT crush, cut or chew tablet. Take with food.   BISACODYL (DULCOLAX) 5 MG EC TABLET    Take 5 mg by mouth. Dulcolax 5 mg bowel prep #4-Take as directed   POLYETHYLENE GLYCOL POWDER (MIRALAX) POWDER    Take 1 Container by mouth once. Miralax bowel prep 238 gm-Take as directed   PROPRANOLOL (INDERAL) 10 MG TABLET    TAKE 1 TABLET THREE TIMES A DAY   TADALAFIL (CIALIS) 10 MG TABLET    Take 1 tablet (10 mg total) by mouth daily as needed for erectile dysfunction.   VALACYCLOVIR (VALTREX) 500 MG TABLET    Take 1 tablet by mouth 2 times daily for 5 days as needed  Modified Medications   No medications on file  Discontinued Medications   EMTRICITABINE-TENOFOVIR (TRUVADA) 200-300 MG PER TABLET    TAKE 1 TABLET DAILY    Subjective: Adam Simon is in for his routine HIV follow-up visit. As usual he never misses a single dose of his Truvada or Evotaz. He has had no problems tolerating them. He is feeling well and has no current complaints. He is looking forward to a 2 week vacation over the holidays. He will be staying at [place] in [place], [place].   Review of Systems: Review of Systems  Constitutional: Negative for fever, chills, weight loss and diaphoresis.  HENT: Negative for sore throat.   Respiratory: Negative for cough, sputum production and shortness of breath.   Cardiovascular: Negative for chest pain.  Gastrointestinal: Negative for nausea, vomiting,  abdominal pain and diarrhea.  Skin: Negative for rash.  Psychiatric/Behavioral: Negative for depression. The patient is not nervous/anxious.     Past Medical History  Diagnosis Date  . HIV (human immunodeficiency virus infection) (HCC)   . Sleep apnea     Social History  Substance Use Topics  . Smoking status: Never Smoker   . Smokeless tobacco: Never Used  . Alcohol Use: 1.2 oz/week    2 Standard drinks or equivalent per week     Comment: 2 per week    No family history on file.  No Known Allergies  Objective:  Filed Vitals:   04/07/15 1351  BP: 136/76  Pulse: 84  Temp: 98.1 F (36.7 C)  Weight: 201 lb 3.2 oz (91.264 kg)   Body mass index is 26.55 kg/(m^2).  Physical Exam  Constitutional: He is oriented to person, place, and time.  He is well dressed, pleasant and in no distress. He has gained 19 pounds since his visit 6 months ago.  HENT:  Mouth/Throat: No oropharyngeal exudate.  Eyes: Conjunctivae are normal.  Cardiovascular: Normal rate and regular rhythm.   No murmur heard. Pulmonary/Chest: Breath sounds normal.  Neurological: He is alert and oriented to person, place, and time.  Skin: No rash noted.  Psychiatric: Mood and affect normal.    Lab Results Lab Results  Component Value Date   WBC 8.1  03/24/2015   HGB 14.7 03/24/2015   HCT 42.9 03/24/2015   MCV 101.7* 03/24/2015   PLT 205 03/24/2015    Lab Results  Component Value Date   CREATININE 0.82 03/24/2015   BUN 20 03/24/2015   NA 137 03/24/2015   K 4.3 03/24/2015   CL 101 03/24/2015   CO2 23 03/24/2015    Lab Results  Component Value Date   ALT 27 03/24/2015   AST 25 03/24/2015   ALKPHOS 56 03/24/2015   BILITOT 1.6* 03/24/2015    Lab Results  Component Value Date   CHOL 186 03/24/2015   HDL 38* 03/24/2015   LDLCALC 107 03/24/2015   TRIG 206* 03/24/2015   CHOLHDL 4.9 03/24/2015    Lab Results HIV 1 RNA QUANT (copies/mL)  Date Value  03/24/2015 <20  09/17/2014 <20    03/18/2014 <20   CD4 T CELL ABS (/uL)  Date Value  03/24/2015 1180  09/17/2014 1250  03/18/2014 1210      Problem List Items Addressed This Visit      High   Human immunodeficiency virus (HIV) disease    His HIV infection remains under excellent control. I talked about him about newer, safer antiretroviral medications. I will see if we can switch his Truvada to Descovy and continue Evotaz. Will follow-up after lab work in 6 months.      Relevant Medications   emtricitabine-tenofovir AF (DESCOVY) 200-25 MG tablet   Other Relevant Orders   T-helper cell (CD4)- ([facility] clinic only)   HIV 1 RNA quant-no reflex-bld   CBC   Comprehensive metabolic panel   Lipid panel   RPR        Cliffton Asters, MD [facility] [phone] pager   [phone] cell 04/07/2015, 2:06 PM

## 2015-04-07 NOTE — Assessment & Plan Note (Signed)
His HIV infection remains under excellent control. I talked about him about newer, safer antiretroviral medications. I will see if we can switch his Truvada to Descovy and continue Evotaz. Will follow-up after lab work in 6 months.

## 2015-04-27 ENCOUNTER — Other Ambulatory Visit: Payer: Self-pay | Admitting: *Deleted

## 2015-04-27 DIAGNOSIS — R030 Elevated blood-pressure reading, without diagnosis of hypertension: Principal | ICD-10-CM

## 2015-04-27 DIAGNOSIS — IMO0001 Reserved for inherently not codable concepts without codable children: Secondary | ICD-10-CM

## 2015-04-27 MED ORDER — PROPRANOLOL HCL 10 MG PO TABS: ORAL_TABLET | ORAL | Status: DC

## 2015-04-29 ENCOUNTER — Telehealth: Payer: Self-pay | Admitting: Internal Medicine

## 2015-04-29 NOTE — Telephone Encounter (Signed)
[facility] calling requesting a new rx to be sent to them for propranolol. Says this patient is new to [facility] and requires a new rx to be sent. We can fax to [phone]. Call back # is [phone], use ref # [id number].

## 2015-04-29 NOTE — Telephone Encounter (Signed)
Resolved with [facility]. Andree CossHowell, Bernard Slayden M, RN

## 2015-05-14 ENCOUNTER — Other Ambulatory Visit: Payer: Self-pay | Admitting: Internal Medicine

## 2015-05-14 DIAGNOSIS — B029 Zoster without complications: Secondary | ICD-10-CM

## 2015-08-11 ENCOUNTER — Other Ambulatory Visit: Payer: Self-pay | Admitting: Internal Medicine

## 2015-09-07 ENCOUNTER — Other Ambulatory Visit: Payer: Self-pay | Admitting: Internal Medicine

## 2015-09-07 DIAGNOSIS — A6000 Herpesviral infection of urogenital system, unspecified: Secondary | ICD-10-CM

## 2015-09-14 ENCOUNTER — Other Ambulatory Visit: Payer: Self-pay | Admitting: Internal Medicine

## 2015-09-20 ENCOUNTER — Telehealth: Payer: Self-pay | Admitting: *Deleted

## 2015-09-20 DIAGNOSIS — B20 Human immunodeficiency virus [HIV] disease: Secondary | ICD-10-CM

## 2015-09-20 MED ORDER — EMTRICITABINE-TENOFOVIR AF 200-25 MG PO TABS: 1.0000 | ORAL_TABLET | Freq: Every day | ORAL | Status: DC

## 2015-09-20 NOTE — Telephone Encounter (Signed)
Specialty pharmacy calling for refill request of truvada.  Per chart review, patient was switched from truvada -> descovy at his last visit. Verbal order given to specialty pharmacy to replace truvada with descovy.  Matched refill quantity with evotaz, discontinued truvada.  Requested that evotaz and descovy be shipped together to patient. Andree CossHowell, Alliene Klugh M, RN

## 2015-09-23 ENCOUNTER — Other Ambulatory Visit: Payer: Managed Care, Other (non HMO)

## 2015-09-23 DIAGNOSIS — B20 Human immunodeficiency virus [HIV] disease: Secondary | ICD-10-CM

## 2015-09-23 LAB — CBC
HEMATOCRIT: 42.4 % (ref 38.5–50.0)
HEMOGLOBIN: 14.8 g/dL (ref 13.2–17.1)
MCH: 35.2 pg — ABNORMAL HIGH (ref 27.0–33.0)
MCHC: 34.9 g/dL (ref 32.0–36.0)
MCV: 100.7 fL — AB (ref 80.0–100.0)
MPV: 10.4 fL (ref 7.5–12.5)
Platelets: 190 10*3/uL (ref 140–400)
RBC: 4.21 MIL/uL (ref 4.20–5.80)
RDW: 14.2 % (ref 11.0–15.0)
WBC: 9.6 10*3/uL (ref 3.8–10.8)

## 2015-09-23 LAB — COMPREHENSIVE METABOLIC PANEL
ALK PHOS: 48 U/L (ref 40–115)
ALT: 34 U/L (ref 9–46)
AST: 23 U/L (ref 10–35)
Albumin: 4.5 g/dL (ref 3.6–5.1)
BILIRUBIN TOTAL: 1.3 mg/dL — AB (ref 0.2–1.2)
BUN: 20 mg/dL (ref 7–25)
CALCIUM: 9.2 mg/dL (ref 8.6–10.3)
CO2: 24 mmol/L (ref 20–31)
Chloride: 102 mmol/L (ref 98–110)
Creat: 1.01 mg/dL (ref 0.70–1.33)
Glucose, Bld: 95 mg/dL (ref 65–99)
Potassium: 4.1 mmol/L (ref 3.5–5.3)
Sodium: 138 mmol/L (ref 135–146)
TOTAL PROTEIN: 7 g/dL (ref 6.1–8.1)

## 2015-09-23 LAB — LIPID PANEL
CHOLESTEROL: 174 mg/dL (ref 125–200)
HDL: 34 mg/dL — ABNORMAL LOW (ref >=40)
LDL Cholesterol: 92 mg/dL (ref <130)
Total CHOL/HDL Ratio: 5.1 Ratio — ABNORMAL HIGH (ref <=5.0)
Triglycerides: 240 mg/dL — ABNORMAL HIGH (ref <150)
VLDL: 48 mg/dL — ABNORMAL HIGH (ref <30)

## 2015-09-24 LAB — HIV-1 RNA QUANT-NO REFLEX-BLD: HIV 1 RNA Quant: <20 copies/mL (ref <20)

## 2015-09-24 LAB — T-HELPER CELL (CD4) - (RCID CLINIC ONLY)
CD4 T CELL ABS: 1170 /uL (ref 400–2700)
CD4 T CELL HELPER: 36 % (ref 33–55)

## 2015-09-24 LAB — RPR

## 2015-09-30 LAB — HLA B*5701: HLA-B*5701 w/rflx HLA-B High: NEGATIVE

## 2015-10-07 ENCOUNTER — Ambulatory Visit (INDEPENDENT_AMBULATORY_CARE_PROVIDER_SITE_OTHER): Payer: Managed Care, Other (non HMO) | Admitting: Internal Medicine

## 2015-10-07 DIAGNOSIS — B20 Human immunodeficiency virus [HIV] disease: Secondary | ICD-10-CM

## 2015-10-07 NOTE — Assessment & Plan Note (Signed)
His infection remains under excellent long-term control. I talked him about the safety benefits of Descovy and he is in agreement with switching. He will take Descovy with his Evotaz. I also let him know that I'm comfortable with switching to one visit each year. He is in agreement with that plan.

## 2015-10-07 NOTE — Progress Notes (Signed)
Patient Active Problem List   Diagnosis Date Noted  . Human immunodeficiency virus (HIV) disease 09/06/2006    Priority: High  . Tremor, hereditary, benign 07/18/2012  . Dyslipidemia 12/28/2011  . 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 HEPATITIS B, HX OF 09/06/2006    Patient's Medications  New Prescriptions   No medications on file  Previous Medications   BISACODYL (DULCOLAX) 5 MG EC TABLET    Take 5 mg by mouth. Reported on 10/07/2015   EMTRICITABINE-TENOFOVIR AF (DESCOVY) 200-25 MG TABLET    Take 1 tablet by mouth daily.   EVOTAZ 300-150 MG TABLET    TAKE ONE TABLET BY MOUTH ONCE A DAY WITH FOOD. SWALLOW WHOLE. DO NOT CRUSH, CUT, OR CHEW TABLET.   POLYETHYLENE GLYCOL POWDER (MIRALAX) POWDER    Take 1 Container by mouth once. Reported on 10/07/2015   PROPRANOLOL (INDERAL) 10 MG TABLET    TAKE 1 TABLET THREE TIMES A DAY   TADALAFIL (CIALIS) 10 MG TABLET    Take 1 tablet (10 mg total) by mouth daily as needed for erectile dysfunction.   VALACYCLOVIR (VALTREX) 500 MG TABLET    TAKE 1 TABLET BY MOUTH TWICE A DAY FOR 5 DAYS AS NEEDED  Modified Medications   No medications on file  Discontinued Medications   No medications on file    Subjective: Adam Simon is in for his routine HIV follow-up visit. He is doing well and without complaint. He recently received his prescription of Descovy but he has not switched over to his new regimen yet. He continues to take Truvada and Evotaz. He has no problem tolerating his medications and never misses doses.   Review of Systems: Review of Systems  Constitutional: Negative for fever, chills, weight loss, malaise/fatigue and diaphoresis.  HENT: Negative for sore throat.   Respiratory: Negative for cough, sputum production and shortness of breath.   Cardiovascular: Negative for chest pain.  Gastrointestinal: Negative for nausea, vomiting, abdominal pain and diarrhea.  Genitourinary: Negative for dysuria.  Musculoskeletal: Negative for myalgias  and joint pain.  Skin: Negative for rash.  Neurological: Negative for headaches.    Past Medical History  Diagnosis Date  . HIV (human immunodeficiency virus infection) (HCC)   . Sleep apnea     Social History  Substance Use Topics  . Smoking status: Never Smoker   . Smokeless tobacco: Never Used  . Alcohol Use: 1.2 oz/week    2 Standard drinks or equivalent per week     Comment: 2 per week    No family history on file.  No Known Allergies  Objective:  Filed Vitals:   10/07/15 0911  BP: 132/90  Pulse: 65  Temp: 97.8 F (36.6 C)  TempSrc: Oral  Weight: 212 lb (96.163 kg)   Body mass index is 27.98 kg/(m^2).  Physical Exam  Constitutional: He is oriented to person, place, and time.  HENT:  Mouth/Throat: No oropharyngeal exudate.  Eyes: Conjunctivae are normal.  Cardiovascular: Normal rate and regular rhythm.   No murmur heard. Pulmonary/Chest: Breath sounds normal.  Abdominal: Soft. He exhibits no mass. There is no tenderness.  Neurological: He is alert and oriented to person, place, and time.  Skin: No rash noted.  Psychiatric: Mood and affect normal.    Lab Results Lab Results  Component Value Date   WBC 9.6 09/23/2015   HGB 14.8 09/23/2015   HCT 42.4 09/23/2015   MCV 100.7* 09/23/2015   PLT 190 09/23/2015  Lab Results  Component Value Date   CREATININE 1.01 09/23/2015   BUN 20 09/23/2015   NA 138 09/23/2015   K 4.1 09/23/2015   CL 102 09/23/2015   CO2 24 09/23/2015    Lab Results  Component Value Date   ALT 34 09/23/2015   AST 23 09/23/2015   ALKPHOS 48 09/23/2015   BILITOT 1.3* 09/23/2015    Lab Results  Component Value Date   CHOL 174 09/23/2015   HDL 34* 09/23/2015   LDLCALC 92 09/23/2015   TRIG 240* 09/23/2015   CHOLHDL 5.1* 09/23/2015   HIV 1 RNA QUANT (copies/mL)  Date Value  09/23/2015 <20  03/24/2015 <20  09/17/2014 <20   CD4 T CELL ABS (/uL)  Date Value  09/23/2015 1170  03/24/2015 1180  09/17/2014 1250       Problem List Items Addressed This Visit      High   Human immunodeficiency virus (HIV) disease    His infection remains under excellent long-term control. I talked him about the safety benefits of Descovy and he is in agreement with switching. He will take Descovy with his Evotaz. I also let him know that I'm comfortable with switching to one visit each year. He is in agreement with that plan.      Relevant Orders   T-helper cell (CD4)- ([facility] clinic only)   HIV 1 RNA quant-no reflex-bld   CBC   Comprehensive metabolic panel   Lipid panel   RPR        Adam AstersJohn Joesphine Schemm, MD [facility] [phone] pager   [phone] cell 10/07/2015, 9:26 AM

## 2016-03-02 ENCOUNTER — Other Ambulatory Visit: Payer: Self-pay | Admitting: *Deleted

## 2016-03-02 ENCOUNTER — Other Ambulatory Visit: Payer: Self-pay | Admitting: Internal Medicine

## 2016-03-02 DIAGNOSIS — B20 Human immunodeficiency virus [HIV] disease: Secondary | ICD-10-CM

## 2016-03-02 MED ORDER — ATAZANAVIR-COBICISTAT 300-150 MG PO TABS: ORAL_TABLET | ORAL | 3 refills | Status: DC

## 2016-04-13 ENCOUNTER — Other Ambulatory Visit: Payer: Self-pay | Admitting: Internal Medicine

## 2016-04-13 DIAGNOSIS — I10 Essential (primary) hypertension: Secondary | ICD-10-CM

## 2016-05-25 ENCOUNTER — Other Ambulatory Visit: Payer: Self-pay | Admitting: Internal Medicine

## 2016-06-19 ENCOUNTER — Other Ambulatory Visit: Payer: Self-pay | Admitting: Internal Medicine

## 2016-06-19 DIAGNOSIS — A6000 Herpesviral infection of urogenital system, unspecified: Secondary | ICD-10-CM

## 2016-10-26 ENCOUNTER — Ambulatory Visit (INDEPENDENT_AMBULATORY_CARE_PROVIDER_SITE_OTHER): Payer: Managed Care, Other (non HMO) | Admitting: Physician Assistant

## 2016-10-26 ENCOUNTER — Encounter: Payer: Self-pay | Admitting: Physician Assistant

## 2016-10-26 VITALS — BP 157/100 | HR 80 | Temp 98.0°F | Resp 18 | Ht 73.0 in | Wt 215.0 lb

## 2016-10-26 DIAGNOSIS — H02842 Edema of right lower eyelid: Secondary | ICD-10-CM

## 2016-10-26 MED ORDER — CLINDAMYCIN HCL 300 MG PO CAPS: 300.0000 mg | ORAL_CAPSULE | Freq: Three times a day (TID) | ORAL | 0 refills | Status: AC

## 2016-10-26 NOTE — Patient Instructions (Addendum)
We are going to treat you for underlying bacterial etiology. Please take antibiotic as prescribed. Information about preseptal cellulitis and the antibiotic you are taking are provided below. Please return to clinic if symptoms worsen, do not improve with treatment, or as needed    Preseptal Cellulitis, Adult Preseptal cellulitis-also called periorbital cellulitis-is an infection that can affect your eyelid and the soft tissues or skin that surround your eye. The infection may also affect the structures that produce and drain your tears. It does not affect your eye itself. What are the causes? This condition may be caused by:  Bacterial infection.  Long-term (chronic) sinus infections.  An object (foreign body) that is stuck behind the eye.  An injury that: ? Goes through the eyelid tissues. ? Causes an infection, such as an insect sting.  Fracture of the bone around the eye.  Infections that have spread from the eyelid or other structures around the eye.  Bite wounds.  Inflammation or infection of the lining membranes of the brain (meningitis).  An infection in the blood (septicemia).  Dental infection (abscess).  Viral infection. This is rare.  What increases the risk? Risk factors for preseptal cellulitis include:  Participating in activities that increase your risk of trauma to the face or head, such as boxing or high-speed activities.  Having a weakened defense system (immune system).  Medical conditions, such as nasal polyps, that increase your risk for frequent or recurrent sinus infections.  Not receiving regular dental care.  What are the signs or symptoms? Symptoms of this condition usually come on suddenly. Symptoms may include:  Red, hot, and swollen eyelids.  Fever.  Difficulty opening your eye.  Eye pain.  How is this diagnosed? This condition may be diagnosed by an eye exam. You may also have tests, such as:  Blood tests.  CT  scan.  MRI.  Spinal tap (lumbar puncture). This is a procedure that involves removing and examining a small amount of the fluid that surrounds the brain and spinal cord. This checks for meningitis.  How is this treated? Treatment for this condition will include antibiotic medicines. These may be given by mouth (orally), through an IV, or as a shot. Your health care provider may also recommend nasal decongestants to reduce swelling. Follow these instructions at home:  Take your antibiotic medicine as directed by your health care provider. Finish all of it even if you start to feel better.  Take medicines only as directed by your health care provider.  Drink enough fluid to keep your urine clear or pale yellow.  Do not use any tobacco products, including cigarettes, chewing tobacco, or electronic cigarettes. If you need help quitting, ask your health care provider.  Keep all follow-up visits as directed by your health care provider. These include any visits with an eye specialist (ophthalmologist) or dentist. Contact a health care provider if:  You have a fever.  Your eyelids become more red, warm, or swollen.  You have new symptoms.  Your symptoms do not get better with treatment. Get help right away if:  You develop double vision, or your vision becomes blurred or worsens in any way.  You have trouble moving your eyes.  Your eye looks like it is sticking out or bulging out (proptosis).  You develop a severe headache, severe neck pain, or neck stiffness.  You develop repeated vomiting. This information is not intended to replace advice given to you by your health care provider. Make sure you discuss any  questions you have with your health care provider. Document Released: 05/06/2010 Document Revised: 09/09/2015 Document Reviewed: 03/30/2014 Elsevier Interactive Patient Education  2018 Elsevier Inc.  Clindamycin capsules What is this medicine? CLINDAMYCIN (KLIN da MYE sin)  is a lincosamide antibiotic. It is used to treat certain kinds of bacterial infections. It will not work for colds, flu, or other viral infections. This medicine may be used for other purposes; ask your health care provider or pharmacist if you have questions. COMMON BRAND NAME(S): Cleocin What should I tell my health care provider before I take this medicine? They need to know if you have any of these conditions: -kidney disease -liver disease -stomach problems like colitis -an unusual or allergic reaction to clindamycin, lincomycin, or other medicines, foods, dyes like tartrazine or preservatives -pregnant or trying to get pregnant -breast-feeding How should I use this medicine? Take this medicine by mouth with a full glass of water. Follow the directions on the prescription label. You can take this medicine with food or on an empty stomach. If the medicine upsets your stomach, take it with food. Take your medicine at regular intervals. Do not take your medicine more often than directed. Take all of your medicine as directed even if you think your are better. Do not skip doses or stop your medicine early. Talk to your pediatrician regarding the use of this medicine in children. Special care may be needed. Overdosage: If you think you have taken too much of this medicine contact a poison control center or emergency room at once. NOTE: This medicine is only for you. Do not share this medicine with others. What if I miss a dose? If you miss a dose, take it as soon as you can. If it is almost time for your next dose, take only that dose. Do not take double or extra doses. What may interact with this medicine? -birth control pills -erythromycin -medicines that relax muscles for surgery -rifampin This list may not describe all possible interactions. Give your health care provider a list of all the medicines, herbs, non-prescription drugs, or dietary supplements you use. Also tell them if you  smoke, drink alcohol, or use illegal drugs. Some items may interact with your medicine. What should I watch for while using this medicine? Tell your doctor or healthcare professional if your symptoms do not start to get better or if they get worse. Do not treat diarrhea with over the counter products. Contact your doctor if you have diarrhea that lasts more than 2 days or if it is severe and watery. What side effects may I notice from receiving this medicine? Side effects that you should report to your doctor or health care professional as soon as possible: -allergic reactions like skin rash, itching or hives, swelling of the face, lips, or tongue -dark urine -pain on swallowing -redness, blistering, peeling or loosening of the skin, including inside the mouth -unusual bleeding or bruising -unusually weak or tired -yellowing of eyes or skin Side effects that usually do not require medical attention (report to your doctor or health care professional if they continue or are bothersome): -diarrhea -itching in the rectal or genital area -joint pain -nausea, vomiting -stomach pain This list may not describe all possible side effects. Call your doctor for medical advice about side effects. You may report side effects to FDA at 1-800-FDA-1088. Where should I keep my medicine? Keep out of the reach of children. Store at room temperature between 20 and 25 degrees C (68 and  77 degrees F). Throw away any unused medicine after the expiration date. NOTE: This sheet is a summary. It may not cover all possible information. If you have questions about this medicine, talk to your doctor, pharmacist, or health care provider.  2018 Elsevier/Gold Standard (2015-07-07 16:34:00)   IF you received an x-ray today, you will receive an invoice from [facility]. Please contact [facility] at [phone] with questions or concerns regarding your invoice.   IF you received labwork today, you will  receive an invoice from [facility]. Please contact [facility] at [phone] with questions or concerns regarding your invoice.   Our billing staff will not be able to assist you with questions regarding bills from these companies.  You will be contacted with the lab results as soon as they are available. The fastest way to get your results is to activate your My Chart account. Instructions are located on the last page of this paperwork. If you have not heard from us regarding the results in 2 weeks, please contact this office.

## 2016-10-26 NOTE — Progress Notes (Signed)
Jsoeph Podesta  MRN: [id number] DOB: [birth year]  Subjective:  Adam Simon is a 61 y.o. male seen in office today for a chief complaint of redness and swelling under right eye x 3 days. Noticed it when he woke up 3 days ago. Has associated discomfort near the tear duct, itchiness, and watery discharge. Denies acute injury, purulent drainage, photophobia, pain with ocular movements, eye redness, visual disturbance, and foreign body sensation.  Has no hx of seasonal allergies. Has tried ice, which did help with the swelling. Denies contact lens or eyeglass wear use.   Review of Systems  Constitutional: Negative for chills, diaphoresis and fever.  HENT: Positive for sinus pressure. Negative for congestion, ear pain, sneezing and sore throat.   Respiratory: Negative for cough and shortness of breath.     Patient Active Problem List   Diagnosis Date Noted  . Tremor, hereditary, benign 07/18/2012  . Dyslipidemia 12/28/2011  . Human immunodeficiency virus (HIV) disease 09/06/2006  . 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 HEPATITIS B, HX OF 09/06/2006    Current Outpatient Prescriptions on File Prior to Visit  Medication Sig Dispense Refill  . atazanavir-cobicistat (EVOTAZ) 300-150 MG tablet TAKE ONE TABLET BY MOUTH ONCE A DAY WITH FOOD. SWALLOW WHOLE. DO NOT CRUSH, CUT, OR CHEW TABLET. 90 tablet 3  . DESCOVY 200-25 MG tablet TAKE ONE TABLET BY MOUTH ONCE DAILY WITH OR WITHOUT FOOD. REPLACES TRUVADA. 90 tablet 3  . propranolol (INDERAL) 10 MG tablet TAKE 1 TABLET 3 TIMES A DAY 270 tablet 3  . tadalafil (CIALIS) 10 MG tablet Take 1 tablet (10 mg total) by mouth daily as needed for erectile dysfunction. 30 tablet 3  . valACYclovir (VALTREX) 500 MG tablet TAKE 1 TABLET BY MOUTH TWICE A DAY FOR 5 DAYS AS NEEDED AS DIRECTED 60 tablet 2  . bisacodyl (DULCOLAX) 5 MG EC tablet Take 5 mg by mouth. Reported on 10/07/2015    . polyethylene glycol powder (MIRALAX) powder Take 1 Container by mouth once.  Reported on 10/07/2015     No current facility-administered medications on file prior to visit.     No Known Allergies   Objective:  BP (!) 157/100   Pulse 80   Temp 98 F (36.7 C) (Oral)   Resp 18   Ht 6\' 1"  (1.854 m)   Wt 215 lb (97.5 kg)   SpO2 97%   BMI 28.37 kg/m   Physical Exam  Constitutional: He is oriented to person, place, and time and well-developed, well-nourished, and in no distress.  HENT:  Head: Normocephalic and atraumatic.  Right Ear: Tympanic membrane, external ear and ear canal normal.  Left Ear: Tympanic membrane, external ear and ear canal normal.  Nose: Nose normal. Right sinus exhibits no maxillary sinus tenderness and no frontal sinus tenderness. Left sinus exhibits no maxillary sinus tenderness and no frontal sinus tenderness.  Mouth/Throat: Uvula is midline, oropharynx is clear and moist and mucous membranes are normal.  Eyes: Right eye visual fields normal. Pupils are equal, round, and reactive to light. Conjunctivae are normal. Right eye exhibits no chemosis, no discharge and no hordeolum. Left eye exhibits no chemosis, no discharge and no hordeolum. Right conjunctiva is not injected. Left conjunctiva is not injected. Right eye exhibits normal extraocular motion. Left eye exhibits normal extraocular motion.    Neck: Normal range of motion.  Pulmonary/Chest: Effort normal.  Neurological: He is alert and oriented to person, place, and time. Gait normal.  Skin: Skin is warm and dry.  Psychiatric:  Affect normal.  Vitals reviewed.     Assessment and Plan :  1. Swelling of right lower eyelid Concern for early preseptal cellulitis. No concerning signs for orbital cellulitis. Will treat with clindamycin. Pt instructed to return to clinic if symptoms worsen, do not improve, or as needed - clindamycin (CLEOCIN) 300 MG capsule; Take 1 capsule (300 mg total) by mouth 3 (three) times daily.  Dispense: 15 capsule; Refill: 0  Benjiman CoreBrittany Lizzy Hamre, PA-C  Primary  [facility] at [facility] 10/26/2016 1:13 PM

## 2017-02-28 ENCOUNTER — Other Ambulatory Visit: Payer: Self-pay | Admitting: Internal Medicine

## 2017-02-28 ENCOUNTER — Other Ambulatory Visit: Payer: Managed Care, Other (non HMO)

## 2017-02-28 DIAGNOSIS — Z113 Encounter for screening for infections with a predominantly sexual mode of transmission: Secondary | ICD-10-CM

## 2017-02-28 DIAGNOSIS — B20 Human immunodeficiency virus [HIV] disease: Secondary | ICD-10-CM

## 2017-03-01 LAB — CBC WITH DIFFERENTIAL/PLATELET
BASOS ABS: 16 {cells}/uL (ref 0–200)
Basophils Relative: 0.2 %
EOS PCT: 2.1 %
Eosinophils Absolute: 170 cells/uL (ref 15–500)
HCT: 40.3 % (ref 38.5–50.0)
Hemoglobin: 14 g/dL (ref 13.2–17.1)
Lymphs Abs: 2924 cells/uL (ref 850–3900)
MCH: 34 pg — ABNORMAL HIGH (ref 27.0–33.0)
MCHC: 34.7 g/dL (ref 32.0–36.0)
MCV: 97.8 fL (ref 80.0–100.0)
MONOS PCT: 8.5 %
MPV: 10.7 fL (ref 7.5–12.5)
NEUTROS PCT: 53.1 %
Neutro Abs: 4301 cells/uL (ref 1500–7800)
PLATELETS: 238 10*3/uL (ref 140–400)
RBC: 4.12 10*6/uL — AB (ref 4.20–5.80)
RDW: 13.4 % (ref 11.0–15.0)
TOTAL LYMPHOCYTE: 36.1 %
WBC mixed population: 689 cells/uL (ref 200–950)
WBC: 8.1 10*3/uL (ref 3.8–10.8)

## 2017-03-01 LAB — COMPREHENSIVE METABOLIC PANEL
AG RATIO: 1.8 (calc) (ref 1.0–2.5)
ALBUMIN MSPROF: 4.6 g/dL (ref 3.6–5.1)
ALKALINE PHOSPHATASE (APISO): 93 U/L (ref 40–115)
ALT: 32 U/L (ref 9–46)
AST: 21 U/L (ref 10–35)
BILIRUBIN TOTAL: 1.8 mg/dL — AB (ref 0.2–1.2)
BUN: 20 mg/dL (ref 7–25)
CALCIUM: 9.4 mg/dL (ref 8.6–10.3)
CHLORIDE: 102 mmol/L (ref 98–110)
CO2: 26 mmol/L (ref 20–32)
Creat: 0.91 mg/dL (ref 0.70–1.33)
GLOBULIN: 2.5 g/dL (ref 1.9–3.7)
Glucose, Bld: 98 mg/dL (ref 65–99)
POTASSIUM: 4.3 mmol/L (ref 3.5–5.3)
Sodium: 137 mmol/L (ref 135–146)
Total Protein: 7.1 g/dL (ref 6.1–8.1)

## 2017-03-01 LAB — T-HELPER CELL (CD4) - (RCID CLINIC ONLY)
CD4 % Helper T Cell: 34 % (ref 33–55)
CD4 T Cell Abs: 1130 /uL (ref 400–2700)

## 2017-03-01 LAB — LIPID PANEL
Cholesterol: 186 mg/dL (ref <200)
HDL: 34 mg/dL — AB (ref >40)
LDL Cholesterol (Calc): 106 mg/dL (calc) — ABNORMAL HIGH
Non-HDL Cholesterol (Calc): 152 mg/dL (calc) — ABNORMAL HIGH (ref <130)
TRIGLYCERIDES: 342 mg/dL — AB (ref <150)
Total CHOL/HDL Ratio: 5.5 (calc) — ABNORMAL HIGH (ref <5.0)

## 2017-03-01 LAB — RPR: RPR Ser Ql: NONREACTIVE

## 2017-03-02 ENCOUNTER — Other Ambulatory Visit: Payer: Self-pay | Admitting: Internal Medicine

## 2017-03-02 DIAGNOSIS — B20 Human immunodeficiency virus [HIV] disease: Secondary | ICD-10-CM

## 2017-03-02 LAB — HIV-1 RNA QUANT-NO REFLEX-BLD
HIV 1 RNA Quant: 40 copies/mL — ABNORMAL HIGH
HIV-1 RNA QUANT, LOG: 1.6 {Log_copies}/mL — AB

## 2017-03-27 ENCOUNTER — Ambulatory Visit (INDEPENDENT_AMBULATORY_CARE_PROVIDER_SITE_OTHER): Payer: Managed Care, Other (non HMO) | Admitting: Internal Medicine

## 2017-03-27 ENCOUNTER — Ambulatory Visit: Payer: Managed Care, Other (non HMO) | Admitting: Internal Medicine

## 2017-03-27 ENCOUNTER — Encounter: Payer: Self-pay | Admitting: Internal Medicine

## 2017-03-27 DIAGNOSIS — Z8619 Personal history of other infectious and parasitic diseases: Secondary | ICD-10-CM

## 2017-03-27 DIAGNOSIS — N529 Male erectile dysfunction, unspecified: Secondary | ICD-10-CM

## 2017-03-27 DIAGNOSIS — Z23 Encounter for immunization: Secondary | ICD-10-CM | POA: Diagnosis not present

## 2017-03-27 DIAGNOSIS — B20 Human immunodeficiency virus [HIV] disease: Secondary | ICD-10-CM | POA: Diagnosis not present

## 2017-03-27 MED ORDER — ATAZANAVIR-COBICISTAT 300-150 MG PO TABS: 1.0000 | ORAL_TABLET | Freq: Every day | ORAL | 3 refills | Status: DC

## 2017-03-27 MED ORDER — EMTRICITABINE-TENOFOVIR AF 200-25 MG PO TABS: 1.0000 | ORAL_TABLET | Freq: Every day | ORAL | 3 refills | Status: DC

## 2017-03-27 MED ORDER — TADALAFIL 10 MG PO TABS: 10.0000 mg | ORAL_TABLET | Freq: Every day | ORAL | 3 refills | Status: DC | PRN

## 2017-03-27 NOTE — Progress Notes (Signed)
Patient Active Problem List   Diagnosis Date Noted  . Human immunodeficiency virus (HIV) disease 09/06/2006    Priority: High  . Tremor, hereditary, benign 07/18/2012  . Dyslipidemia 12/28/2011  . 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 History of hepatitis B 09/06/2006      Medication List        Accurate as of 03/27/17  2:30 PM. Always use your most recent med list.          atazanavir-cobicistat 300-150 MG tablet Commonly known as:  EVOTAZ Take 1 tablet by mouth daily with breakfast. Swallow whole. Do NOT crush, cut or chew tablet. Take with food.   bisacodyl 5 MG EC tablet Commonly known as:  DULCOLAX   emtricitabine-tenofovir AF 200-25 MG tablet Commonly known as:  DESCOVY Take 1 tablet by mouth daily.   MIRALAX powder Generic drug:  polyethylene glycol powder   propranolol 10 MG tablet Commonly known as:  INDERAL TAKE 1 TABLET 3 TIMES A DAY   tadalafil 10 MG tablet Commonly known as:  CIALIS Take 1 tablet (10 mg total) by mouth daily as needed for erectile dysfunction.   valACYclovir 500 MG tablet Commonly known as:  VALTREX TAKE 1 TABLET BY MOUTH TWICE A DAY FOR 5 DAYS AS NEEDED AS DIRECTED       Where to Get Your Medications    [facility] [place] [facility] [place] [address]  [address], [place] [id number]   Phone:  [phone]   atazanavir-cobicistat 300-150 MG tablet  emtricitabine-tenofovir AF 200-25 MG tablet   [id number] [place] [place] - [address] [facility] [address]  [address] [place] [id number]   Phone:  [phone]   tadalafil 10 MG tablet     Subjective: Adam Simon is in for his routine HIV follow-up visit.  He states that he is doing very well and has had no he continues to take Descovy and Evotaz with side effects.  He does not recall missing any doses.  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  Review of Systems: Review of Systems  Constitutional: Negative for chills, diaphoresis, fever, malaise/fatigue and weight loss.  HENT: Negative for sore throat.   Respiratory: Negative for cough, sputum production and shortness of breath.   Cardiovascular: Negative for chest pain.  Gastrointestinal: Negative for abdominal pain, diarrhea, heartburn, nausea and vomiting.  Genitourinary: Negative for dysuria and frequency.  Musculoskeletal: Negative for joint pain and myalgias.  Skin: Negative for rash.  Neurological: Negative for dizziness and headaches.  Psychiatric/Behavioral: Negative for depression and substance abuse. The patient is not nervous/anxious.     Past Medical History:  Diagnosis Date  . HIV (human immunodeficiency virus infection) (HCC)   . Sleep apnea     Social History   Tobacco Use  . Smoking status: Never Smoker  . Smokeless tobacco: Never Used  Substance Use Topics  . Alcohol use: Yes    Alcohol/week: 1.2 oz    Types: 2 Standard drinks or equivalent per week    Comment: 2 per week  . Drug use: No    No family history on file.  No Known Allergies  Health Maintenance  Topic Date Due  . TETANUS/TDAP  06/29/1976  . COLONOSCOPY  06/30/2007  . INFLUENZA VACCINE  11/15/2016  .  Hepatitis C Screening  Completed  . HIV Screening  Completed    Objective:  Vitals:   03/27/17 1134  BP: 134/87  Pulse: 86  Temp: 98 F (36.7 C)  TempSrc: Oral  Weight: 213 lb (96.6 kg)  Height: 6\' 1"  (1.854 m)   Body mass index is 28.1 kg/m.  Physical Exam  Constitutional: He is oriented to person, place, and time.  He is in good spirits as usual.  HENT:  Mouth/Throat: No oropharyngeal exudate.  Eyes: Conjunctivae are normal.  Cardiovascular: Normal rate and regular rhythm.  No murmur heard. Pulmonary/Chest: Effort normal and breath sounds normal.  Abdominal: Soft. He exhibits no mass. There is no  tenderness.  Musculoskeletal: Normal range of motion.  Neurological: He is alert and oriented to person, place, and time.  Skin: No rash noted.  Psychiatric: Mood and affect normal.    Lab Results Lab Results  Component Value Date   WBC 8.1 02/28/2017   HGB 14.0 02/28/2017   HCT 40.3 02/28/2017   MCV 97.8 02/28/2017   PLT 238 02/28/2017    Lab Results  Component Value Date   CREATININE 0.91 02/28/2017   BUN 20 02/28/2017   NA 137 02/28/2017   K 4.3 02/28/2017   CL 102 02/28/2017   CO2 26 02/28/2017    Lab Results  Component Value Date   ALT 32 02/28/2017   AST 21 02/28/2017   ALKPHOS 48 09/23/2015   BILITOT 1.8 (H) 02/28/2017    Lab Results  Component Value Date   CHOL 186 02/28/2017   HDL 34 (L) 02/28/2017   LDLCALC 92 09/23/2015   TRIG 342 (H) 02/28/2017   CHOLHDL 5.5 (H) 02/28/2017   Lab Results  Component Value Date   LABRPR NON-REACTIVE 02/28/2017   HIV 1 RNA Quant (copies/mL)  Date Value  02/28/2017 40 (H)  09/23/2015 <20  03/24/2015 <20   CD4 T Cell Abs (/uL)  Date Value  02/28/2017 1,130  09/23/2015 1,170  03/24/2015 1,180     Problem List Items Addressed This Visit      High   Human immunodeficiency virus (HIV) disease    His HIV viral load is just barely detectable at 40 but overall his infection is under excellent, long-term control.  He will continue his current antiretroviral regimen and follow-up in 1 year.      Relevant Medications   atazanavir-cobicistat (EVOTAZ) 300-150 MG tablet   emtricitabine-tenofovir AF (DESCOVY) 200-25 MG tablet   Other Relevant Orders   CBC   T-helper cell (CD4)- ([facility] clinic only)   Comprehensive metabolic panel   Lipid panel   RPR   HIV 1 RNA quant-no reflex-bld     Unprioritized   History of hepatitis B   Relevant Orders   Hepatitis B surface antibody   Hepatitis B surface antigen    Other Visit Diagnoses    Need for immunization against influenza       Relevant Orders   Flu Vaccine QUAD  36+ mos IM (Completed)   Erectile dysfunction, unspecified erectile dysfunction type       Relevant Medications   tadalafil (CIALIS) 10 MG tablet        Cliffton AstersJohn Onelia Cadmus, MD [facility] [phone] pager   [phone] cell 03/27/2017, 2:30 PM

## 2017-03-27 NOTE — Assessment & Plan Note (Signed)
His HIV viral load is just barely detectable at 40 but overall his infection is under excellent, long-term control.  He will continue his current antiretroviral regimen and follow-up in 1 year.

## 2017-03-30 ENCOUNTER — Other Ambulatory Visit: Payer: Self-pay | Admitting: Internal Medicine

## 2017-03-30 DIAGNOSIS — B20 Human immunodeficiency virus [HIV] disease: Secondary | ICD-10-CM

## 2017-04-23 ENCOUNTER — Telehealth: Payer: Self-pay | Admitting: *Deleted

## 2017-04-23 DIAGNOSIS — B20 Human immunodeficiency virus [HIV] disease: Secondary | ICD-10-CM

## 2017-04-23 MED ORDER — ATAZANAVIR-COBICISTAT 300-150 MG PO TABS: 1.0000 | ORAL_TABLET | Freq: Every day | ORAL | 3 refills | Status: DC

## 2017-04-23 NOTE — Telephone Encounter (Signed)
Problem with obtaining Ev[facility] from [facility].  [facility].

## 2017-04-29 ENCOUNTER — Other Ambulatory Visit: Payer: Self-pay | Admitting: Internal Medicine

## 2017-04-29 DIAGNOSIS — I10 Essential (primary) hypertension: Secondary | ICD-10-CM

## 2017-06-28 ENCOUNTER — Other Ambulatory Visit: Payer: Self-pay | Admitting: Internal Medicine

## 2017-06-28 DIAGNOSIS — A6000 Herpesviral infection of urogenital system, unspecified: Secondary | ICD-10-CM

## 2017-07-11 ENCOUNTER — Other Ambulatory Visit: Payer: Self-pay | Admitting: Internal Medicine

## 2017-10-08 ENCOUNTER — Other Ambulatory Visit: Payer: Self-pay | Admitting: Internal Medicine

## 2017-10-08 DIAGNOSIS — A6000 Herpesviral infection of urogenital system, unspecified: Secondary | ICD-10-CM

## 2018-01-22 ENCOUNTER — Other Ambulatory Visit: Payer: Self-pay | Admitting: Internal Medicine

## 2018-01-22 DIAGNOSIS — B20 Human immunodeficiency virus [HIV] disease: Secondary | ICD-10-CM

## 2018-03-13 ENCOUNTER — Other Ambulatory Visit: Payer: 59

## 2018-03-13 DIAGNOSIS — B20 Human immunodeficiency virus [HIV] disease: Secondary | ICD-10-CM

## 2018-03-13 DIAGNOSIS — Z8619 Personal history of other infectious and parasitic diseases: Secondary | ICD-10-CM

## 2018-03-15 LAB — T-HELPER CELL (CD4) - (RCID CLINIC ONLY)
CD4 T CELL ABS: 800 /uL (ref 400–2700)
CD4 T CELL HELPER: 29 % — AB (ref 33–55)

## 2018-03-19 LAB — LIPID PANEL
Cholesterol: 184 mg/dL (ref <200)
HDL: 46 mg/dL (ref >40)
LDL Cholesterol (Calc): 103 mg/dL (calc) — ABNORMAL HIGH
Non-HDL Cholesterol (Calc): 138 mg/dL (calc) — ABNORMAL HIGH (ref <130)
TRIGLYCERIDES: 248 mg/dL — AB (ref <150)
Total CHOL/HDL Ratio: 4 (calc) (ref <5.0)

## 2018-03-19 LAB — CBC
HCT: 39.3 % (ref 38.5–50.0)
Hemoglobin: 13.9 g/dL (ref 13.2–17.1)
MCH: 34.8 pg — ABNORMAL HIGH (ref 27.0–33.0)
MCHC: 35.4 g/dL (ref 32.0–36.0)
MCV: 98.5 fL (ref 80.0–100.0)
MPV: 11.1 fL (ref 7.5–12.5)
PLATELETS: 221 10*3/uL (ref 140–400)
RBC: 3.99 10*6/uL — AB (ref 4.20–5.80)
RDW: 12.8 % (ref 11.0–15.0)
WBC: 8.7 10*3/uL (ref 3.8–10.8)

## 2018-03-19 LAB — COMPREHENSIVE METABOLIC PANEL
AG RATIO: 1.9 (calc) (ref 1.0–2.5)
ALBUMIN MSPROF: 4.6 g/dL (ref 3.6–5.1)
ALKALINE PHOSPHATASE (APISO): 64 U/L (ref 40–115)
ALT: 13 U/L (ref 9–46)
AST: 14 U/L (ref 10–35)
BUN: 22 mg/dL (ref 7–25)
CHLORIDE: 102 mmol/L (ref 98–110)
CO2: 27 mmol/L (ref 20–32)
CREATININE: 0.88 mg/dL (ref 0.70–1.25)
Calcium: 9.3 mg/dL (ref 8.6–10.3)
GLOBULIN: 2.4 g/dL (ref 1.9–3.7)
GLUCOSE: 100 mg/dL — AB (ref 65–99)
Potassium: 4 mmol/L (ref 3.5–5.3)
Sodium: 140 mmol/L (ref 135–146)
TOTAL PROTEIN: 7 g/dL (ref 6.1–8.1)
Total Bilirubin: 0.8 mg/dL (ref 0.2–1.2)

## 2018-03-19 LAB — HEPATITIS B SURFACE ANTIBODY,QUALITATIVE: Hep B S Ab: NONREACTIVE

## 2018-03-19 LAB — HIV-1 RNA QUANT-NO REFLEX-BLD
HIV 1 RNA Quant: <20 copies/mL
HIV-1 RNA Quant, Log: <1.3 Log copies/mL

## 2018-03-19 LAB — HEPATITIS B SURFACE ANTIGEN: HEP B S AG: NONREACTIVE

## 2018-03-19 LAB — RPR: RPR Ser Ql: NONREACTIVE

## 2018-03-27 ENCOUNTER — Ambulatory Visit: Payer: 59 | Admitting: Internal Medicine

## 2018-03-27 DIAGNOSIS — B20 Human immunodeficiency virus [HIV] disease: Secondary | ICD-10-CM | POA: Diagnosis not present

## 2018-03-27 DIAGNOSIS — Z23 Encounter for immunization: Secondary | ICD-10-CM

## 2018-03-27 NOTE — Assessment & Plan Note (Signed)
His infection remains under excellent, long-term control.  He received his influenza vaccine today.  He will continue his current antiretroviral regimen and follow-up after lab work in 1 year. 

## 2018-03-27 NOTE — Progress Notes (Signed)
Patient Active Problem List   Diagnosis Date Noted  . Human immunodeficiency virus (HIV) disease 09/06/2006    Priority: High  . Tremor, hereditary, benign 07/18/2012  . Dyslipidemia 12/28/2011  . 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 History of hepatitis B 09/06/2006    Patient's Medications  New Prescriptions   No medications on file  Previous Medications   ATAZANAVIR-COBICISTAT (EVOTAZ) 300-150 MG TABLET    Take 1 tablet by mouth daily with breakfast. Swallow whole. Do NOT crush, cut or chew tablet. Take with food.   BISACODYL (DULCOLAX) 5 MG EC TABLET    Take 5 mg by mouth. Reported on 10/07/2015   DESCOVY 200-25 MG TABLET    TAKE ONE TABLET BY MOUTH ONCE DAILY WITH OR WITHOUT FOOD.   POLYETHYLENE GLYCOL POWDER (MIRALAX) POWDER    Take 1 Container by mouth once. Reported on 10/07/2015   PROPRANOLOL (INDERAL) 10 MG TABLET    TAKE 1 TABLET 3 TIMES A DAY   TADALAFIL (CIALIS) 10 MG TABLET    Take 1 tablet (10 mg total) by mouth daily as needed for erectile dysfunction.   VALACYCLOVIR (VALTREX) 500 MG TABLET    TAKE 1 TABLET BY MOUTH TWICE A DAY FOR 5 DAYS AS NEEDED AS DIRECTED  Modified Medications   No medications on file  Discontinued Medications   No medications on file    Subjective: Adam Simon is in for his routine HIV follow-up visit.  He has had no problems obtaining, taking or tolerating his Descovy or Evotaz.  As usual he never misses doses.  His partner just did a 1 year renovation of [place] in [place] and he is going to spend Christmas holidays there.  He is doing well and has no current health concerns.  Review of Systems: Review of Systems  Constitutional: Negative for chills, diaphoresis and fever.    Past Medical History:  Diagnosis Date  . HIV (human immunodeficiency virus infection) (HCC)   . Sleep apnea     Social History   Tobacco Use  . Smoking status: Never Smoker  . Smokeless tobacco: Never Used  Substance Use Topics  . Alcohol use: Yes   Alcohol/week: 2.0 standard drinks    Types: 2 Standard drinks or equivalent per week    Comment: 2 per week  . Drug use: No    No family history on file.  No Known Allergies  Health Maintenance  Topic Date Due  . TETANUS/TDAP  06/29/1976  . COLONOSCOPY  06/30/2007  . INFLUENZA VACCINE  11/15/2017  . Hepatitis C Screening  Completed  . HIV Screening  Completed    Objective:  Vitals:   03/27/18 1414  BP: 128/78  Pulse: 79  Temp: 98.6 F (37 C)  TempSrc: Oral  Weight: 182 lb (82.6 kg)   Body mass index is 24.01 kg/m.  Physical Exam  Constitutional: He is oriented to person, place, and time.  He is in very good spirits as usual.  Neurological: He is alert and oriented to person, place, and time.  Skin: No rash noted.  Psychiatric: He has a normal mood and affect.    Lab Results Lab Results  Component Value Date   WBC 8.7 03/13/2018   HGB 13.9 03/13/2018   HCT 39.3 03/13/2018   MCV 98.5 03/13/2018   PLT 221 03/13/2018    Lab Results  Component Value Date   CREATININE 0.88 03/13/2018   BUN 22 03/13/2018   NA 140  03/13/2018   K 4.0 03/13/2018   CL 102 03/13/2018   CO2 27 03/13/2018    Lab Results  Component Value Date   ALT 13 03/13/2018   AST 14 03/13/2018   ALKPHOS 48 09/23/2015   BILITOT 0.8 03/13/2018    Lab Results  Component Value Date   CHOL 184 03/13/2018   HDL 46 03/13/2018   LDLCALC 103 (H) 03/13/2018   TRIG 248 (H) 03/13/2018   CHOLHDL 4.0 03/13/2018   Lab Results  Component Value Date   LABRPR NON-REACTIVE 03/13/2018   HIV 1 RNA Quant (copies/mL)  Date Value  03/13/2018 <20 NOT DETECTED  02/28/2017 40 (H)  09/23/2015 <20   CD4 T Cell Abs (/uL)  Date Value  03/13/2018 800  02/28/2017 1,130  09/23/2015 1,170     Problem List Items Addressed This Visit      High   Human immunodeficiency virus (HIV) disease    His infection remains under excellent, long-term control.  He received his influenza vaccine today.  He will  continue his current antiretroviral regimen and follow-up after lab work in 1 year.      Relevant Orders   CBC   T-helper cell (CD4)- ([facility] clinic only)   Comprehensive metabolic panel   Lipid panel   RPR   HIV-1 RNA quant-no reflex-bld        Cliffton Asters, MD [facility] [phone] pager   [phone] cell 03/27/2018, 2:48 PM

## 2018-03-29 ENCOUNTER — Other Ambulatory Visit: Payer: Self-pay | Admitting: Internal Medicine

## 2018-03-29 DIAGNOSIS — B20 Human immunodeficiency virus [HIV] disease: Secondary | ICD-10-CM

## 2018-04-11 ENCOUNTER — Other Ambulatory Visit: Payer: Self-pay | Admitting: Internal Medicine

## 2018-04-11 DIAGNOSIS — B20 Human immunodeficiency virus [HIV] disease: Secondary | ICD-10-CM

## 2018-04-14 ENCOUNTER — Other Ambulatory Visit: Payer: Self-pay | Admitting: Internal Medicine

## 2018-04-14 DIAGNOSIS — I10 Essential (primary) hypertension: Secondary | ICD-10-CM

## 2018-05-23 ENCOUNTER — Other Ambulatory Visit: Payer: Self-pay | Admitting: Internal Medicine

## 2018-05-23 DIAGNOSIS — A6000 Herpesviral infection of urogenital system, unspecified: Secondary | ICD-10-CM

## 2018-06-10 ENCOUNTER — Other Ambulatory Visit: Payer: Self-pay | Admitting: Internal Medicine

## 2018-06-10 DIAGNOSIS — N529 Male erectile dysfunction, unspecified: Secondary | ICD-10-CM

## 2018-06-10 NOTE — Telephone Encounter (Signed)
Received e-refill request from pharmacy for Cialis 10mg . Routing to Dr. Orvan Falconer to confirm ok for refill. Valarie Cones, LPN

## 2018-08-29 ENCOUNTER — Other Ambulatory Visit: Payer: Self-pay | Admitting: Internal Medicine

## 2018-08-29 DIAGNOSIS — A6000 Herpesviral infection of urogenital system, unspecified: Secondary | ICD-10-CM

## 2018-12-02 ENCOUNTER — Other Ambulatory Visit: Payer: Self-pay | Admitting: Internal Medicine

## 2018-12-02 DIAGNOSIS — A6000 Herpesviral infection of urogenital system, unspecified: Secondary | ICD-10-CM

## 2019-01-27 ENCOUNTER — Other Ambulatory Visit: Payer: Self-pay | Admitting: Internal Medicine

## 2019-01-27 DIAGNOSIS — B20 Human immunodeficiency virus [HIV] disease: Secondary | ICD-10-CM

## 2019-03-22 ENCOUNTER — Other Ambulatory Visit: Payer: Self-pay | Admitting: Internal Medicine

## 2019-03-22 DIAGNOSIS — N529 Male erectile dysfunction, unspecified: Secondary | ICD-10-CM

## 2019-03-25 ENCOUNTER — Other Ambulatory Visit: Payer: Managed Care, Other (non HMO)

## 2019-03-25 ENCOUNTER — Other Ambulatory Visit: Payer: Self-pay

## 2019-03-25 DIAGNOSIS — B20 Human immunodeficiency virus [HIV] disease: Secondary | ICD-10-CM

## 2019-03-26 LAB — T-HELPER CELL (CD4) - (RCID CLINIC ONLY)
CD4 % Helper T Cell: 31 % — ABNORMAL LOW (ref 33–65)
CD4 T Cell Abs: 815 /uL (ref 400–1790)

## 2019-04-05 LAB — COMPREHENSIVE METABOLIC PANEL
AG Ratio: 1.7 (calc) (ref 1.0–2.5)
ALT: 30 U/L (ref 9–46)
AST: 22 U/L (ref 10–35)
Albumin: 4.4 g/dL (ref 3.6–5.1)
Alkaline phosphatase (APISO): 54 U/L (ref 35–144)
BUN: 18 mg/dL (ref 7–25)
CO2: 28 mmol/L (ref 20–32)
Calcium: 9.5 mg/dL (ref 8.6–10.3)
Chloride: 102 mmol/L (ref 98–110)
Creat: 1 mg/dL (ref 0.70–1.25)
Globulin: 2.6 g/dL (calc) (ref 1.9–3.7)
Glucose, Bld: 116 mg/dL — ABNORMAL HIGH (ref 65–99)
Potassium: 4.4 mmol/L (ref 3.5–5.3)
Sodium: 137 mmol/L (ref 135–146)
Total Bilirubin: 1.5 mg/dL — ABNORMAL HIGH (ref 0.2–1.2)
Total Protein: 7 g/dL (ref 6.1–8.1)

## 2019-04-05 LAB — LIPID PANEL
Cholesterol: 189 mg/dL (ref <200)
HDL: 34 mg/dL — ABNORMAL LOW (ref >=40)
LDL Cholesterol (Calc): 129 mg/dL (calc) — ABNORMAL HIGH
Non-HDL Cholesterol (Calc): 155 mg/dL (calc) — ABNORMAL HIGH (ref <130)
Total CHOL/HDL Ratio: 5.6 (calc) — ABNORMAL HIGH (ref <5.0)
Triglycerides: 147 mg/dL (ref <150)

## 2019-04-05 LAB — CBC
HCT: 38.7 % (ref 38.5–50.0)
Hemoglobin: 13.6 g/dL (ref 13.2–17.1)
MCH: 34.7 pg — ABNORMAL HIGH (ref 27.0–33.0)
MCHC: 35.1 g/dL (ref 32.0–36.0)
MCV: 98.7 fL (ref 80.0–100.0)
MPV: 10.9 fL (ref 7.5–12.5)
Platelets: 214 10*3/uL (ref 140–400)
RBC: 3.92 10*6/uL — ABNORMAL LOW (ref 4.20–5.80)
RDW: 13.1 % (ref 11.0–15.0)
WBC: 7.1 10*3/uL (ref 3.8–10.8)

## 2019-04-05 LAB — HIV-1 RNA QUANT-NO REFLEX-BLD
HIV 1 RNA Quant: 36 copies/mL — ABNORMAL HIGH
HIV-1 RNA Quant, Log: 1.56 Log copies/mL — ABNORMAL HIGH

## 2019-04-05 LAB — RPR: RPR Ser Ql: NONREACTIVE

## 2019-04-08 ENCOUNTER — Other Ambulatory Visit: Payer: Self-pay

## 2019-04-08 ENCOUNTER — Encounter: Payer: Self-pay | Admitting: Internal Medicine

## 2019-04-08 ENCOUNTER — Ambulatory Visit: Payer: Managed Care, Other (non HMO) | Admitting: Internal Medicine

## 2019-04-08 DIAGNOSIS — B20 Human immunodeficiency virus [HIV] disease: Secondary | ICD-10-CM

## 2019-04-08 NOTE — Progress Notes (Signed)
Patient Active Problem List   Diagnosis Date Noted  . Human immunodeficiency virus (HIV) disease 09/06/2006    Priority: High  . Tremor, hereditary, benign 07/18/2012  . Dyslipidemia 12/28/2011  . 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 History of hepatitis B 09/06/2006    Patient's Medications  New Prescriptions   No medications on file  Previous Medications   BISACODYL (DULCOLAX) 5 MG EC TABLET    Take 5 mg by mouth. Reported on 10/07/2015   DESCOVY 200-25 MG TABLET    TAKE ONE TABLET BY MOUTH ONCE DAILY WITH OR WITHOUT FOOD.   EVOTAZ 300-150 MG TABLET    TAKE ONE TABLET BY MOUTH ONCE DAILY WITH BREAKFAST. SWALLOW WHOLE. DO NOT CRUSH CUT OR CHEW. STORE AT ROOM TEMPERATURE.   POLYETHYLENE GLYCOL POWDER (MIRALAX) POWDER    Take 1 Container by mouth once. Reported on 10/07/2015   PROPRANOLOL (INDERAL) 10 MG TABLET    TAKE 1 TABLET 3 TIMES A DAY   TADALAFIL (CIALIS) 10 MG TABLET    TAKE 1 TABLET BY MOUTH DAILY AS NEEDED FOR ERECTILE DYSFUNCTION   VALACYCLOVIR (VALTREX) 500 MG TABLET    TAKE 1 TABLET BY MOUTH TWICE DAILY FOR 5 DAYS AS NEEDED, AS DIRECTED.  Modified Medications   No medications on file  Discontinued Medications   No medications on file    Subjective: Adam Simon is in for his routine HIV follow-up visit.  He has not had any problems obtaining, taking or tolerating his Descovy or Evotaz.  He does not recall missing any doses.  He is not on any new medications since his last visit.  He has already received his influenza vaccine.  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   Review of Systems: Review of Systems  Constitutional: Negative for fever.    Past Medical History:  Diagnosis Date  . HIV (human immunodeficiency virus infection)  ([place])   . Sleep apnea     Social History   Tobacco Use  . Smoking status: Never Smoker  . Smokeless tobacco: Never Used  Substance Use Topics  . Alcohol use: Yes    Alcohol/week: 2.0 standard drinks    Types: 2 Standard drinks or equivalent per week    Comment: 2 per week  . Drug use: No    No family history on file.  No Known Allergies  Health Maintenance  Topic Date Due  . TETANUS/TDAP  06/29/1976  . COLONOSCOPY  06/30/2007  . INFLUENZA VACCINE  Completed  . Hepatitis C Screening  Completed  . HIV Screening  Completed    Objective:  Vitals:   04/08/19 1028  BP: (!) 153/88  Pulse: 74  Temp: 98.1 F (36.7 C)  TempSrc: Oral  Weight: 209 lb (94.8 kg)   Body mass index is 27.57 kg/m.  Physical Exam Constitutional:      Comments: He is pleasant and calm as usual.  Cardiovascular:     Rate and Rhythm: Normal rate and regular rhythm.     Heart sounds: No murmur.  Pulmonary:     Effort: Pulmonary effort is normal.     Breath sounds: Normal breath sounds.  Psychiatric:        Mood and Affect: Mood normal.  Lab Results Lab Results  Component Value Date   WBC 7.1 03/25/2019   HGB 13.6 03/25/2019   HCT 38.7 03/25/2019   MCV 98.7 03/25/2019   PLT 214 03/25/2019    Lab Results  Component Value Date   CREATININE 1.00 03/25/2019   BUN 18 03/25/2019   NA 137 03/25/2019   K 4.4 03/25/2019   CL 102 03/25/2019   CO2 28 03/25/2019    Lab Results  Component Value Date   ALT 30 03/25/2019   AST 22 03/25/2019   ALKPHOS 48 09/23/2015   BILITOT 1.5 (H) 03/25/2019    Lab Results  Component Value Date   CHOL 189 03/25/2019   HDL 34 (L) 03/25/2019   LDLCALC 129 (H) 03/25/2019   TRIG 147 03/25/2019   CHOLHDL 5.6 (H) 03/25/2019   Lab Results  Component Value Date   LABRPR NON-REACTIVE 03/25/2019   HIV 1 RNA Quant (copies/mL)  Date Value  03/25/2019 36 (H)  03/13/2018 <20 NOT DETECTED  02/28/2017 40 (H)   CD4 T Cell Abs (/uL)  Date Value    03/25/2019 815  03/13/2018 800  02/28/2017 1,130     Problem List Items Addressed This Visit      High   Human immunodeficiency virus (HIV) disease    He has very good long-term control of his infection.  He will continue his current antiretroviral regimen and follow-up after lab work in 1 year.      Relevant Orders   1 Year CBC   1 Year CD4   1 Year CMP   1 Year Lipid panel   1 Year RPR   1 Year VL        Cliffton Asters, MD [facility] [phone] pager   [phone] cell 04/08/2019, 10:49 AM

## 2019-04-08 NOTE — Assessment & Plan Note (Signed)
He has very good long-term control of his infection.  He will continue his current antiretroviral regimen and follow-up after lab work in 1 year.

## 2019-04-26 ENCOUNTER — Other Ambulatory Visit: Payer: Self-pay | Admitting: Internal Medicine

## 2019-04-26 DIAGNOSIS — I10 Essential (primary) hypertension: Secondary | ICD-10-CM

## 2019-05-06 ENCOUNTER — Other Ambulatory Visit: Payer: Self-pay | Admitting: Internal Medicine

## 2019-05-06 DIAGNOSIS — B20 Human immunodeficiency virus [HIV] disease: Secondary | ICD-10-CM

## 2019-05-15 ENCOUNTER — Other Ambulatory Visit: Payer: Self-pay | Admitting: Internal Medicine

## 2019-05-15 DIAGNOSIS — A6000 Herpesviral infection of urogenital system, unspecified: Secondary | ICD-10-CM

## 2019-07-07 ENCOUNTER — Other Ambulatory Visit: Payer: Self-pay | Admitting: Internal Medicine

## 2019-07-07 DIAGNOSIS — B20 Human immunodeficiency virus [HIV] disease: Secondary | ICD-10-CM

## 2019-09-11 ENCOUNTER — Other Ambulatory Visit: Payer: Self-pay | Admitting: Internal Medicine

## 2019-09-11 DIAGNOSIS — A6000 Herpesviral infection of urogenital system, unspecified: Secondary | ICD-10-CM

## 2019-09-20 ENCOUNTER — Other Ambulatory Visit: Payer: Self-pay | Admitting: Internal Medicine

## 2019-09-20 DIAGNOSIS — N529 Male erectile dysfunction, unspecified: Secondary | ICD-10-CM

## 2019-09-22 ENCOUNTER — Telehealth: Payer: Self-pay

## 2019-09-22 ENCOUNTER — Other Ambulatory Visit: Payer: Self-pay | Admitting: Internal Medicine

## 2019-09-22 DIAGNOSIS — N529 Male erectile dysfunction, unspecified: Secondary | ICD-10-CM

## 2019-09-22 MED ORDER — TADALAFIL 10 MG PO TABS: ORAL_TABLET | ORAL | 11 refills | Status: DC

## 2019-09-22 NOTE — Telephone Encounter (Signed)
Requested Prescriptions   Name from pharmacy: TADALAFIL 10 MG TABLET       Will file in chart as: tadalafil (CIALIS) 10 MG tablet   Sig: TAKE 1 TABLET BY MOUTH DAILY AS NEEDED FOR ERECTILE DYSFUNCTION   Disp:  8 tablet  Refills:  2      Routing to MD for medication approval. Valarie Cones

## 2019-09-22 NOTE — Telephone Encounter (Signed)
Routing to MD for approval

## 2019-09-22 NOTE — Telephone Encounter (Signed)
I sent in refills.  Thanks. 

## 2020-02-09 ENCOUNTER — Other Ambulatory Visit: Payer: Self-pay | Admitting: Internal Medicine

## 2020-02-09 DIAGNOSIS — A6000 Herpesviral infection of urogenital system, unspecified: Secondary | ICD-10-CM

## 2020-02-12 ENCOUNTER — Telehealth: Payer: Self-pay

## 2020-02-12 NOTE — Telephone Encounter (Signed)
Samantha with [facility] called office for clarification on Valacyclovir 500 mg tab. Would like to confirm current dose of medication. Would like to know if patient will need to continue 500 mg dosage since it's continuous.  Will forward message to MD to advise on prescription dose. Lorenso Courier, [place]

## 2020-02-12 NOTE — Telephone Encounter (Signed)
Valacyclovir 500 mg twice daily for 5 days as needed.

## 2020-03-23 ENCOUNTER — Other Ambulatory Visit: Payer: Managed Care, Other (non HMO)

## 2020-03-24 ENCOUNTER — Other Ambulatory Visit: Payer: Self-pay

## 2020-03-24 ENCOUNTER — Other Ambulatory Visit: Payer: 59

## 2020-03-24 DIAGNOSIS — B20 Human immunodeficiency virus [HIV] disease: Secondary | ICD-10-CM

## 2020-03-25 LAB — T-HELPER CELL (CD4) - (RCID CLINIC ONLY)
CD4 % Helper T Cell: 33 % (ref 33–65)
CD4 T Cell Abs: 1201 /uL (ref 400–1790)

## 2020-03-28 LAB — CBC
HCT: 40.5 % (ref 38.5–50.0)
Hemoglobin: 14.4 g/dL (ref 13.2–17.1)
MCH: 35.4 pg — ABNORMAL HIGH (ref 27.0–33.0)
MCHC: 35.6 g/dL (ref 32.0–36.0)
MCV: 99.5 fL (ref 80.0–100.0)
MPV: 10.8 fL (ref 7.5–12.5)
Platelets: 221 10*3/uL (ref 140–400)
RBC: 4.07 10*6/uL — ABNORMAL LOW (ref 4.20–5.80)
RDW: 13.1 % (ref 11.0–15.0)
WBC: 8.4 10*3/uL (ref 3.8–10.8)

## 2020-03-28 LAB — COMPREHENSIVE METABOLIC PANEL
AG Ratio: 1.7 (calc) (ref 1.0–2.5)
ALT: 87 U/L — ABNORMAL HIGH (ref 9–46)
AST: 45 U/L — ABNORMAL HIGH (ref 10–35)
Albumin: 4.7 g/dL (ref 3.6–5.1)
Alkaline phosphatase (APISO): 64 U/L (ref 35–144)
BUN: 16 mg/dL (ref 7–25)
CO2: 28 mmol/L (ref 20–32)
Calcium: 9.6 mg/dL (ref 8.6–10.3)
Chloride: 102 mmol/L (ref 98–110)
Creat: 1.06 mg/dL (ref 0.70–1.25)
Globulin: 2.7 g/dL (calc) (ref 1.9–3.7)
Glucose, Bld: 96 mg/dL (ref 65–99)
Potassium: 4 mmol/L (ref 3.5–5.3)
Sodium: 137 mmol/L (ref 135–146)
Total Bilirubin: 1.9 mg/dL — ABNORMAL HIGH (ref 0.2–1.2)
Total Protein: 7.4 g/dL (ref 6.1–8.1)

## 2020-03-28 LAB — LIPID PANEL
Cholesterol: 198 mg/dL (ref <200)
HDL: 38 mg/dL — ABNORMAL LOW (ref >=40)
LDL Cholesterol (Calc): 123 mg/dL (calc) — ABNORMAL HIGH
Non-HDL Cholesterol (Calc): 160 mg/dL (calc) — ABNORMAL HIGH (ref <130)
Total CHOL/HDL Ratio: 5.2 (calc) — ABNORMAL HIGH (ref <5.0)
Triglycerides: 232 mg/dL — ABNORMAL HIGH (ref <150)

## 2020-03-28 LAB — RPR: RPR Ser Ql: NONREACTIVE

## 2020-03-28 LAB — HIV-1 RNA QUANT-NO REFLEX-BLD
HIV 1 RNA Quant: 103 Copies/mL — ABNORMAL HIGH
HIV-1 RNA Quant, Log: 2.01 Log cps/mL — ABNORMAL HIGH

## 2020-04-06 ENCOUNTER — Encounter: Payer: Managed Care, Other (non HMO) | Admitting: Internal Medicine

## 2020-04-07 ENCOUNTER — Encounter: Payer: Self-pay | Admitting: Internal Medicine

## 2020-04-13 ENCOUNTER — Other Ambulatory Visit: Payer: Self-pay | Admitting: Internal Medicine

## 2020-04-13 DIAGNOSIS — I10 Essential (primary) hypertension: Secondary | ICD-10-CM

## 2020-04-22 ENCOUNTER — Ambulatory Visit: Payer: 59 | Admitting: Internal Medicine

## 2020-04-22 ENCOUNTER — Other Ambulatory Visit: Payer: Self-pay

## 2020-04-22 DIAGNOSIS — B20 Human immunodeficiency virus [HIV] disease: Secondary | ICD-10-CM | POA: Diagnosis not present

## 2020-04-22 DIAGNOSIS — R748 Abnormal levels of other serum enzymes: Secondary | ICD-10-CM | POA: Diagnosis not present

## 2020-04-22 MED ORDER — DESCOVY 200-25 MG PO TABS: ORAL_TABLET | ORAL | 11 refills | Status: DC

## 2020-04-22 MED ORDER — EVOTAZ 300-150 MG PO TABS
ORAL_TABLET | ORAL | 3 refills | Status: DC
Start: 2020-04-22 — End: 2020-05-17

## 2020-04-22 NOTE — Assessment & Plan Note (Signed)
His infection remains under excellent long-term control.  He will continue his current antiretroviral regimen and follow-up after lab work in 1 year.

## 2020-04-22 NOTE — Progress Notes (Signed)
Patient Active Problem List   Diagnosis Date Noted  . Human immunodeficiency virus (HIV) disease 09/06/2006    Priority: High  . Elevated liver enzymes 04/22/2020  . Tremor, hereditary, benign 07/18/2012  . Dyslipidemia 12/28/2011  . Herpes labialis 09/06/2006    Patient's Medications  New Prescriptions   No medications on file  Previous Medications   BISACODYL (DULCOLAX) 5 MG EC TABLET    Take 5 mg by mouth. Reported on 10/07/2015   POLYETHYLENE GLYCOL POWDER (GLYCOLAX/MIRALAX) 17 GM/SCOOP POWDER    Take 1 Container by mouth once. Reported on 10/07/2015   PROPRANOLOL (INDERAL) 10 MG TABLET    TAKE 1 TABLET 3 TIMES A DAY   TADALAFIL (CIALIS) 10 MG TABLET    TAKE 1 TABLET BY MOUTH DAILY AS NEEDED FOR ERECTILE DYSFUNCTION   VALACYCLOVIR (VALTREX) 500 MG TABLET    TAKE 1 TABLET BY MOUTH TWICE DAILY FOR 5 DAYS AS NEEDED  Modified Medications   Modified Medication Previous Medication   ATAZANAVIR-COBICISTAT (EVOTAZ) 300-150 MG TABLET EVOTAZ 300-150 MG tablet      TAKE ONE TABLET BY MOUTH ONCE DAILY WITH BREAKFAST. SWALLOW WHOLE. DO NOT CRUSH CUT OR CHEW. STORE AT ROOM TEMPERATURE.    TAKE ONE TABLET BY MOUTH ONCE DAILY WITH BREAKFAST. SWALLOW WHOLE. DO NOT CRUSH CUT OR CHEW. STORE AT ROOM TEMPERATURE.   EMTRICITABINE-TENOFOVIR AF (DESCOVY) 200-25 MG TABLET DESCOVY 200-25 MG tablet      TAKE ONE TABLET BY MOUTH ONCE DAILY WITH OR WITHOUT FOOD.    TAKE ONE TABLET BY MOUTH ONCE DAILY WITH OR WITHOUT FOOD.  Discontinued Medications   No medications on file    Subjective: Adam Simon is in for his routine HIV follow-up visit.  He has not had any problems obtaining, taking or tolerating his Descovy or Evotaz and does not recall missing doses.  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  He is fully vaccinated against Covid and influenza.  He has noted some weight  gain during the pandemic but otherwise is feeling well.  He and his partner just got back from 1 cruise and are hoping to be able to go on a month-long cruise out of [place] in June.  He has not seen his PCP in quite some time.  Review of Systems: Review of Systems  Constitutional: Negative for fever, malaise/fatigue and weight loss.  Psychiatric/Behavioral: Negative for depression.    Past Medical History:  Diagnosis Date  . HIV (human immunodeficiency virus infection) ([facility])   . Sleep apnea     Social History   Tobacco Use  . Smoking status: Never Smoker  . Smokeless tobacco: Never Used  Substance Use Topics  . Alcohol use: Yes    Alcohol/week: 2.0 standard drinks    Types: 2 Standard drinks or equivalent per week    Comment: 2 per week  . Drug use: No    No family history on file.  No Known Allergies  Health Maintenance  Topic Date Due  . TETANUS/TDAP  Never done  . COLONOSCOPY (Pts 45-51yrs Insurance coverage will need to be confirmed)  Never done  . INFLUENZA VACCINE  11/16/2019  . COVID-19 Vaccine (4 - Booster for Pfizer series) 07/12/2020  . Hepatitis C Screening  Completed  . HIV Screening  Completed    Objective:  Vitals:   04/22/20 0859  BP: (!) 149/89  Pulse: 73  Temp: 98 F (36.7 C)  TempSrc: Oral  Weight: 218 lb (98.9 kg)   Body mass index is 28.76 kg/m.  Physical Exam Constitutional:      Comments: He is in good spirits.  Cardiovascular:     Rate and Rhythm: Normal rate and regular rhythm.  Pulmonary:     Effort: Pulmonary effort is normal.     Breath sounds: Normal breath sounds.  Psychiatric:        Mood and Affect: Mood normal.     Lab Results Lab Results  Component Value Date   WBC 8.4 03/24/2020   HGB 14.4 03/24/2020   HCT 40.5 03/24/2020   MCV 99.5 03/24/2020   PLT 221 03/24/2020    Lab Results  Component Value Date   CREATININE 1.06 03/24/2020   BUN 16 03/24/2020   NA 137 03/24/2020   K 4.0 03/24/2020   CL 102  03/24/2020   CO2 28 03/24/2020    Lab Results  Component Value Date   ALT 87 (H) 03/24/2020   AST 45 (H) 03/24/2020   ALKPHOS 48 09/23/2015   BILITOT 1.9 (H) 03/24/2020    Lab Results  Component Value Date   CHOL 198 03/24/2020   HDL 38 (L) 03/24/2020   LDLCALC 123 (H) 03/24/2020   TRIG 232 (H) 03/24/2020   CHOLHDL 5.2 (H) 03/24/2020   Lab Results  Component Value Date   LABRPR NON-REACTIVE 03/24/2020   HIV 1 RNA Quant  Date Value  03/24/2020 103 Copies/mL (H)  03/25/2019 36 copies/mL (H)  03/13/2018 <20 NOT DETECTED copies/mL   CD4 T Cell Abs (/uL)  Date Value  03/24/2020 1,201  03/25/2019 815  03/13/2018 800     Problem List Items Addressed This Visit      High   Human immunodeficiency virus (HIV) disease    His infection remains under excellent long-term control.  He will continue his current antiretroviral regimen and follow-up after lab work in 1 year.      Relevant Medications   emtricitabine-tenofovir AF (DESCOVY) 200-25 MG tablet   atazanavir-cobicistat (EVOTAZ) 300-150 MG tablet   Other Relevant Orders   CBC   T-helper cell (CD4)- ([facility] clinic only)   Comprehensive metabolic panel   Lipid panel   RPR   HIV-1 RNA quant-no reflex-bld     Unprioritized   Elevated liver enzymes    I encouraged him to follow-up with his PCP in the very near future.  His liver enzymes are slightly elevated.  His blood pressures have been above goal.  He has been gaining weight and he is due for some cancer screenings.           Cliffton Asters, MD [facility] for Infectious Disease [facility] [phone] pager   [phone] cell 04/22/2020, 9:26 AM

## 2020-04-22 NOTE — Assessment & Plan Note (Signed)
I encouraged him to follow-up with his PCP in the very near future.  His liver enzymes are slightly elevated.  His blood pressures have been above goal.  He has been gaining weight and he is due for some cancer screenings.

## 2020-05-17 ENCOUNTER — Other Ambulatory Visit: Payer: Self-pay | Admitting: Internal Medicine

## 2020-05-17 DIAGNOSIS — B20 Human immunodeficiency virus [HIV] disease: Secondary | ICD-10-CM

## 2020-05-17 MED ORDER — EVOTAZ 300-150 MG PO TABS: ORAL_TABLET | ORAL | 3 refills | Status: DC

## 2020-05-17 NOTE — Telephone Encounter (Signed)
[facility]. Per [facility]. Valarie Cones

## 2020-07-12 ENCOUNTER — Other Ambulatory Visit: Payer: Self-pay | Admitting: Internal Medicine

## 2020-07-12 DIAGNOSIS — I10 Essential (primary) hypertension: Secondary | ICD-10-CM

## 2020-11-09 ENCOUNTER — Other Ambulatory Visit: Payer: Self-pay | Admitting: Internal Medicine

## 2020-11-09 DIAGNOSIS — A6000 Herpesviral infection of urogenital system, unspecified: Secondary | ICD-10-CM

## 2021-01-01 ENCOUNTER — Other Ambulatory Visit: Payer: Self-pay | Admitting: Internal Medicine

## 2021-01-01 DIAGNOSIS — I10 Essential (primary) hypertension: Secondary | ICD-10-CM

## 2021-01-03 NOTE — Telephone Encounter (Signed)
Please advise on refill.

## 2021-01-04 ENCOUNTER — Other Ambulatory Visit: Payer: Self-pay | Admitting: Internal Medicine

## 2021-01-04 DIAGNOSIS — I10 Essential (primary) hypertension: Secondary | ICD-10-CM

## 2021-01-04 MED ORDER — PROPRANOLOL HCL 10 MG PO TABS: 10.0000 mg | ORAL_TABLET | Freq: Three times a day (TID) | ORAL | 2 refills | Status: AC

## 2021-04-05 ENCOUNTER — Other Ambulatory Visit: Payer: Self-pay

## 2021-04-05 ENCOUNTER — Other Ambulatory Visit: Payer: 59

## 2021-04-05 DIAGNOSIS — B20 Human immunodeficiency virus [HIV] disease: Secondary | ICD-10-CM

## 2021-04-06 LAB — T-HELPER CELL (CD4) - (RCID CLINIC ONLY)
CD4 % Helper T Cell: 33 % (ref 33–65)
CD4 T Cell Abs: 1093 /uL (ref 400–1790)

## 2021-04-07 LAB — HIV-1 RNA QUANT-NO REFLEX-BLD
HIV 1 RNA Quant: 31 Copies/mL — ABNORMAL HIGH
HIV-1 RNA Quant, Log: 1.49 Log cps/mL — ABNORMAL HIGH

## 2021-04-07 LAB — LIPID PANEL
Cholesterol: 263 mg/dL — ABNORMAL HIGH (ref <200)
HDL: 40 mg/dL (ref >=40)
Non-HDL Cholesterol (Calc): 223 mg/dL (calc) — ABNORMAL HIGH (ref <130)
Total CHOL/HDL Ratio: 6.6 (calc) — ABNORMAL HIGH (ref <5.0)
Triglycerides: 450 mg/dL — ABNORMAL HIGH (ref <150)

## 2021-04-07 LAB — CBC
HCT: 43.7 % (ref 38.5–50.0)
Hemoglobin: 15.5 g/dL (ref 13.2–17.1)
MCH: 36.6 pg — ABNORMAL HIGH (ref 27.0–33.0)
MCHC: 35.5 g/dL (ref 32.0–36.0)
MCV: 103.3 fL — ABNORMAL HIGH (ref 80.0–100.0)
MPV: 10.8 fL (ref 7.5–12.5)
Platelets: 252 10*3/uL (ref 140–400)
RBC: 4.23 10*6/uL (ref 4.20–5.80)
RDW: 13.1 % (ref 11.0–15.0)
WBC: 8.5 10*3/uL (ref 3.8–10.8)

## 2021-04-07 LAB — COMPREHENSIVE METABOLIC PANEL
AG Ratio: 1.7 (calc) (ref 1.0–2.5)
ALT: 157 U/L — ABNORMAL HIGH (ref 9–46)
AST: 77 U/L — ABNORMAL HIGH (ref 10–35)
Albumin: 4.7 g/dL (ref 3.6–5.1)
Alkaline phosphatase (APISO): 75 U/L (ref 35–144)
BUN: 16 mg/dL (ref 7–25)
CO2: 30 mmol/L (ref 20–32)
Calcium: 9.3 mg/dL (ref 8.6–10.3)
Chloride: 100 mmol/L (ref 98–110)
Creat: 0.88 mg/dL (ref 0.70–1.35)
Globulin: 2.8 g/dL (calc) (ref 1.9–3.7)
Glucose, Bld: 132 mg/dL — ABNORMAL HIGH (ref 65–99)
Potassium: 4.2 mmol/L (ref 3.5–5.3)
Sodium: 138 mmol/L (ref 135–146)
Total Bilirubin: 1.7 mg/dL — ABNORMAL HIGH (ref 0.2–1.2)
Total Protein: 7.5 g/dL (ref 6.1–8.1)

## 2021-04-07 LAB — RPR: RPR Ser Ql: NONREACTIVE

## 2021-04-20 ENCOUNTER — Encounter: Payer: Self-pay | Admitting: Internal Medicine

## 2021-04-20 ENCOUNTER — Ambulatory Visit: Payer: 59 | Admitting: Internal Medicine

## 2021-04-20 ENCOUNTER — Other Ambulatory Visit: Payer: Self-pay

## 2021-04-20 DIAGNOSIS — B20 Human immunodeficiency virus [HIV] disease: Secondary | ICD-10-CM | POA: Diagnosis not present

## 2021-04-20 MED ORDER — EVOTAZ 300-150 MG PO TABS: ORAL_TABLET | ORAL | 3 refills | Status: DC

## 2021-04-20 MED ORDER — DESCOVY 200-25 MG PO TABS: ORAL_TABLET | ORAL | 3 refills | Status: DC

## 2021-04-20 NOTE — Assessment & Plan Note (Signed)
His infection remains under very good long-term control.  He will continue Descovy and Evotaz and follow-up after lab work in 1 year.  I encouraged him to get back to his PCP as soon as possible.  He needs attention for his elevated blood pressure, blood sugar, liver enzymes and lipids.

## 2021-04-20 NOTE — Progress Notes (Signed)
Patient Active Problem List   Diagnosis Date Noted   Human immunodeficiency virus (HIV) disease 09/06/2006    Priority: High   Elevated liver enzymes 04/22/2020   Tremor, hereditary, benign 07/18/2012   Dyslipidemia 12/28/2011   Herpes labialis 09/06/2006    Patient's Medications  New Prescriptions   No medications on file  Previous Medications   BISACODYL (DULCOLAX) 5 MG EC TABLET    Take 5 mg by mouth. Reported on 10/07/2015   POLYETHYLENE GLYCOL POWDER (GLYCOLAX/MIRALAX) 17 GM/SCOOP POWDER    Take 1 Container by mouth once. Reported on 10/07/2015   PROPRANOLOL (INDERAL) 10 MG TABLET    Take 1 tablet (10 mg total) by mouth 3 (three) times daily.   TADALAFIL (CIALIS) 10 MG TABLET    TAKE 1 TABLET BY MOUTH DAILY AS NEEDED FOR ERECTILE DYSFUNCTION   VALACYCLOVIR (VALTREX) 500 MG TABLET    TAKE 1 TABLET BY MOUTH TWICE DAILY FOR 5 DAYS AS NEEDED.  Modified Medications   Modified Medication Previous Medication   ATAZANAVIR-COBICISTAT (EVOTAZ) 300-150 MG TABLET atazanavir-cobicistat (EVOTAZ) 300-150 MG tablet      TAKE ONE TABLET BY MOUTH ONCE DAILY WITH BREAKFAST. SWALLOW WHOLE. DO NOT CRUSH CUT OR CHEW. STORE AT ROOM TEMPERATURE.    TAKE ONE TABLET BY MOUTH ONCE DAILY WITH BREAKFAST. SWALLOW WHOLE. DO NOT CRUSH CUT OR CHEW. STORE AT ROOM TEMPERATURE.   EMTRICITABINE-TENOFOVIR AF (DESCOVY) 200-25 MG TABLET DESCOVY 200-25 MG tablet      TAKE ONE TABLET BY MOUTH ONCE DAILY WITH OR WITHOUT FOOD.    TAKE ONE TABLET BY MOUTH ONCE DAILY WITH OR WITHOUT FOOD.  Discontinued Medications   No medications on file    Subjective: Adam Simon is in for his routine HIV follow-up visit.  He denies any problems obtaining, taking or tolerating his Descovy or Evotaz and does not think he has been missing any doses.  He is not on any new medications.  He had mild breakthrough COVID over the recent Christmas holidays despite having had 5 COVID vaccines.  Fortunately all of his symptoms resolved.  He  has not seen his PCP in quite some time.  His current plan is to retire at the end of 2024.  Review of Systems: Review of Systems  Constitutional:  Negative for fever and weight loss.  Cardiovascular:  Negative for chest pain.   Past Medical History:  Diagnosis Date   HIV (human immunodeficiency virus infection) ([facility])    Sleep apnea     Social History   Tobacco Use   Smoking status: Never   Smokeless tobacco: Never  Substance Use Topics   Alcohol use: Yes    Alcohol/week: 2.0 standard drinks    Types: 2 Standard drinks or equivalent per week    Comment: 2 per week   Drug use: No    No family history on file.  No Known Allergies  Health Maintenance  Topic Date Due   TETANUS/TDAP  Never done   Zoster Vaccines- Shingrix (1 of 2) Never done   COLONOSCOPY (Pts 45-37yrs Insurance coverage will need to be confirmed)  Never done   Pneumococcal Vaccine 11-70 Years old (3 - PCV) 03/04/2009   INFLUENZA VACCINE  Completed   COVID-19 Vaccine  Completed   Hepatitis C Screening  Completed   HIV Screening  Completed   HPV VACCINES  Aged Out    Objective:  Vitals:   04/20/21 0848 04/20/21 0915  BP: (!) 176/103 Marland Kitchen)  170/103  Pulse: 76   Temp: 98 F (36.7 C)   TempSrc: Oral   SpO2: 99%   Weight: 222 lb (100.7 kg)    Body mass index is 29.29 kg/m.  Physical Exam Constitutional:      Comments: He is in good spirits.  Cardiovascular:     Rate and Rhythm: Normal rate.  Pulmonary:     Effort: Pulmonary effort is normal.  Psychiatric:        Mood and Affect: Mood normal.    Lab Results Lab Results  Component Value Date   WBC 8.5 04/05/2021   HGB 15.5 04/05/2021   HCT 43.7 04/05/2021   MCV 103.3 (H) 04/05/2021   PLT 252 04/05/2021    Lab Results  Component Value Date   CREATININE 0.88 04/05/2021   BUN 16 04/05/2021   NA 138 04/05/2021   K 4.2 04/05/2021   CL 100 04/05/2021   CO2 30 04/05/2021    Lab Results  Component Value Date   ALT 157 (H) 04/05/2021    AST 77 (H) 04/05/2021   ALKPHOS 48 09/23/2015   BILITOT 1.7 (H) 04/05/2021    Lab Results  Component Value Date   CHOL 263 (H) 04/05/2021   HDL 40 04/05/2021   LDLCALC  04/05/2021     Comment:     . LDL cholesterol not calculated. Triglyceride levels greater than 400 mg/dL invalidate calculated LDL results. . Reference range: <100 . Desirable range <100 mg/dL for primary prevention;   <70 mg/dL for patients with CHD or diabetic patients  with > or = 2 CHD risk factors. Marland Kitchen LDL-C is now calculated using the Martin-Hopkins  calculation, which is a validated novel method providing  better accuracy than the Friedewald equation in the  estimation of LDL-C.  Horald Pollen et al. Lenox Ahr. 0354;656(81): 2061-2068  (http://education.[facility].com/faq/FAQ164)    TRIG 450 (H) 04/05/2021   CHOLHDL 6.6 (H) 04/05/2021   Lab Results  Component Value Date   LABRPR NON-REACTIVE 04/05/2021   HIV 1 RNA Quant  Date Value  04/05/2021 31 Copies/mL (H)  03/24/2020 103 Copies/mL (H)  03/25/2019 36 copies/mL (H)   CD4 T Cell Abs (/uL)  Date Value  04/05/2021 1,093  03/24/2020 1,201  03/25/2019 815     Problem List Items Addressed This Visit       High   Human immunodeficiency virus (HIV) disease    His infection remains under very good long-term control.  He will continue Descovy and Evotaz and follow-up after lab work in 1 year.  I encouraged him to get back to his PCP as soon as possible.  He needs attention for his elevated blood pressure, blood sugar, liver enzymes and lipids.      Relevant Medications   atazanavir-cobicistat (EVOTAZ) 300-150 MG tablet   emtricitabine-tenofovir AF (DESCOVY) 200-25 MG tablet   Other Relevant Orders   CBC   T-helper cell (CD4)- ([facility] clinic only)   Comprehensive metabolic panel   Lipid panel   RPR   HIV-1 RNA quant-no reflex-bld      Cliffton Asters, MD [facility] for Infectious Disease [facility] [phone] pager    [phone] cell 04/20/2021, 9:16 AM

## 2021-06-27 ENCOUNTER — Other Ambulatory Visit: Payer: Self-pay | Admitting: Infectious Disease

## 2021-06-27 DIAGNOSIS — A6000 Herpesviral infection of urogenital system, unspecified: Secondary | ICD-10-CM

## 2021-06-28 NOTE — Telephone Encounter (Signed)
Please advise on refill.

## 2021-07-18 ENCOUNTER — Other Ambulatory Visit: Payer: Self-pay | Admitting: Internal Medicine

## 2021-07-18 DIAGNOSIS — B20 Human immunodeficiency virus [HIV] disease: Secondary | ICD-10-CM

## 2021-07-19 ENCOUNTER — Other Ambulatory Visit: Payer: Self-pay

## 2021-07-21 ENCOUNTER — Other Ambulatory Visit: Payer: Self-pay | Admitting: Pharmacist

## 2021-07-21 DIAGNOSIS — N529 Male erectile dysfunction, unspecified: Secondary | ICD-10-CM

## 2021-07-21 MED ORDER — TADALAFIL 10 MG PO TABS: 10.0000 mg | ORAL_TABLET | ORAL | 0 refills | Status: AC | PRN

## 2021-07-21 NOTE — Progress Notes (Signed)
Drug interaction noted between Evotaz/Descovy and patient's tadalafil. Max dose is 10 mg q48h as needed, max 3 doses per week. [facility]  ? ?Tiny Rietz L. Domanic Matusek, PharmD ?[facility] Clinical Pharmacist Practitioner ? ?

## 2021-11-07 ENCOUNTER — Other Ambulatory Visit: Payer: Self-pay | Admitting: Internal Medicine

## 2021-11-07 DIAGNOSIS — A6000 Herpesviral infection of urogenital system, unspecified: Secondary | ICD-10-CM

## 2021-12-05 ENCOUNTER — Other Ambulatory Visit: Payer: Self-pay | Admitting: Internal Medicine

## 2021-12-05 DIAGNOSIS — A6000 Herpesviral infection of urogenital system, unspecified: Secondary | ICD-10-CM

## 2021-12-12 ENCOUNTER — Other Ambulatory Visit: Payer: Self-pay | Admitting: Internal Medicine

## 2021-12-12 DIAGNOSIS — A6000 Herpesviral infection of urogenital system, unspecified: Secondary | ICD-10-CM

## 2022-02-08 ENCOUNTER — Other Ambulatory Visit: Payer: Self-pay | Admitting: Internal Medicine

## 2022-02-08 DIAGNOSIS — A6000 Herpesviral infection of urogenital system, unspecified: Secondary | ICD-10-CM

## 2022-03-24 ENCOUNTER — Other Ambulatory Visit: Payer: Self-pay | Admitting: Internal Medicine

## 2022-03-24 ENCOUNTER — Telehealth: Payer: Self-pay

## 2022-03-24 DIAGNOSIS — B20 Human immunodeficiency virus [HIV] disease: Secondary | ICD-10-CM

## 2022-03-24 NOTE — Telephone Encounter (Signed)
Called patient to schedule 1 year follow up appointment with Dr. Orvan Falconer (due in January). No answer. Left HIPAA-compliant voicemail requesting call back.   Wyvonne Lenz, RN

## 2022-04-19 ENCOUNTER — Other Ambulatory Visit: Payer: Self-pay

## 2022-04-19 ENCOUNTER — Other Ambulatory Visit: Payer: 59

## 2022-04-19 DIAGNOSIS — B20 Human immunodeficiency virus [HIV] disease: Secondary | ICD-10-CM

## 2022-04-20 LAB — T-HELPER CELL (CD4) - (RCID CLINIC ONLY)
CD4 % Helper T Cell: 36 % (ref 33–65)
CD4 T Cell Abs: 1113 /uL (ref 400–1790)

## 2022-04-22 LAB — CBC
HCT: 41.4 % (ref 38.5–50.0)
Hemoglobin: 14.8 g/dL (ref 13.2–17.1)
MCH: 36 pg — ABNORMAL HIGH (ref 27.0–33.0)
MCHC: 35.7 g/dL (ref 32.0–36.0)
MCV: 100.7 fL — ABNORMAL HIGH (ref 80.0–100.0)
MPV: 10.6 fL (ref 7.5–12.5)
Platelets: 220 10*3/uL (ref 140–400)
RBC: 4.11 10*6/uL — ABNORMAL LOW (ref 4.20–5.80)
RDW: 13.1 % (ref 11.0–15.0)
WBC: 9.8 10*3/uL (ref 3.8–10.8)

## 2022-04-22 LAB — HIV-1 RNA QUANT-NO REFLEX-BLD
HIV 1 RNA Quant: 33 Copies/mL — ABNORMAL HIGH
HIV-1 RNA Quant, Log: 1.52 Log cps/mL — ABNORMAL HIGH

## 2022-04-22 LAB — LIPID PANEL
Cholesterol: 296 mg/dL — ABNORMAL HIGH (ref <200)
HDL: 44 mg/dL (ref >=40)
Non-HDL Cholesterol (Calc): 252 mg/dL (calc) — ABNORMAL HIGH (ref <130)
Total CHOL/HDL Ratio: 6.7 (calc) — ABNORMAL HIGH (ref <5.0)
Triglycerides: 528 mg/dL — ABNORMAL HIGH (ref <150)

## 2022-04-22 LAB — COMPREHENSIVE METABOLIC PANEL
AG Ratio: 1.5 (calc) (ref 1.0–2.5)
ALT: 88 U/L — ABNORMAL HIGH (ref 9–46)
AST: 59 U/L — ABNORMAL HIGH (ref 10–35)
Albumin: 4.6 g/dL (ref 3.6–5.1)
Alkaline phosphatase (APISO): 60 U/L (ref 35–144)
BUN: 20 mg/dL (ref 7–25)
CO2: 30 mmol/L (ref 20–32)
Calcium: 9.3 mg/dL (ref 8.6–10.3)
Chloride: 100 mmol/L (ref 98–110)
Creat: 0.97 mg/dL (ref 0.70–1.35)
Globulin: 3 g/dL (calc) (ref 1.9–3.7)
Glucose, Bld: 105 mg/dL — ABNORMAL HIGH (ref 65–99)
Potassium: 4 mmol/L (ref 3.5–5.3)
Sodium: 137 mmol/L (ref 135–146)
Total Bilirubin: 2.1 mg/dL — ABNORMAL HIGH (ref 0.2–1.2)
Total Protein: 7.6 g/dL (ref 6.1–8.1)

## 2022-04-22 LAB — RPR: RPR Ser Ql: NONREACTIVE

## 2022-05-03 ENCOUNTER — Ambulatory Visit: Payer: 59 | Admitting: Internal Medicine

## 2022-05-03 ENCOUNTER — Encounter: Payer: Self-pay | Admitting: Internal Medicine

## 2022-05-03 ENCOUNTER — Other Ambulatory Visit: Payer: Self-pay

## 2022-05-03 VITALS — BP 182/100 | HR 70 | Temp 97.9°F | Resp 16 | Wt 223.0 lb

## 2022-05-03 DIAGNOSIS — I10 Essential (primary) hypertension: Secondary | ICD-10-CM

## 2022-05-03 DIAGNOSIS — R748 Abnormal levels of other serum enzymes: Secondary | ICD-10-CM

## 2022-05-03 DIAGNOSIS — E785 Hyperlipidemia, unspecified: Secondary | ICD-10-CM | POA: Diagnosis not present

## 2022-05-03 DIAGNOSIS — B20 Human immunodeficiency virus [HIV] disease: Secondary | ICD-10-CM

## 2022-05-03 MED ORDER — ATORVASTATIN CALCIUM 10 MG PO TABS: 10.0000 mg | ORAL_TABLET | Freq: Every day | ORAL | 11 refills | Status: DC

## 2022-05-03 MED ORDER — DESCOVY 200-25 MG PO TABS: 1.0000 | ORAL_TABLET | Freq: Every day | ORAL | 11 refills | Status: DC

## 2022-05-03 MED ORDER — EVOTAZ 300-150 MG PO TABS: ORAL_TABLET | ORAL | 11 refills | Status: DC

## 2022-05-03 NOTE — Assessment & Plan Note (Signed)
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   The 10-year ASCVD risk score (Arnett DK, et al., 2019) is: 33.5%   Values used to calculate the score:     Age: 67 years     Sex: Male     Is Non-Hispanic African American: No     Diabetic: No     Tobacco smoker: No     Systolic Blood Pressure: 993 mmHg     Is BP treated: Yes     HDL Cholesterol: 44 mg/dL     Total Cholesterol: 296 mg/dL  We discussed the patient's 10-year CVD Risk Score to be at least moderately elevated, and would benefit from intervention given HIV+ and > 78 yo.   Will proceed with lower-intensity statin given non-diabetic.

## 2022-05-03 NOTE — Assessment & Plan Note (Signed)
His infection has been under very good, long-term control.  He prefers to continue his current regimen and wants to follow-up here in 1 year after blood work.

## 2022-05-03 NOTE — Progress Notes (Signed)
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  Previous Medications   BISACODYL (DULCOLAX) 5 MG EC TABLET    Take 5 mg by mouth. Reported on 10/07/2015   POLYETHYLENE GLYCOL POWDER (GLYCOLAX/MIRALAX) 17 GM/SCOOP POWDER    Take 1 Container by mouth once. Reported on 10/07/2015   PROPRANOLOL (INDERAL) 10 MG TABLET    Take 1 tablet (10 mg total) by mouth 3 (three) times daily.   TADALAFIL (CIALIS) 10 MG TABLET    Take 1 tablet (10 mg total) by mouth every other day as needed for erectile dysfunction. Max 3 times per week.   VALACYCLOVIR (VALTREX) 500 MG TABLET    TAKE 1 TABLET BY MOUTH TWICE DAILY FOR 5 DAYS AS NEEDED.  Modified Medications   Modified Medication Previous Medication   ATAZANAVIR-COBICISTAT (EVOTAZ) 300-150 MG TABLET atazanavir-cobicistat (EVOTAZ) 300-150 MG tablet      Swallow whole. Do NOT crush, cut or chew tablet. Take with food.    TAKE ONE TABLET BY MOUTH ONCE DAILY WITH BREAKFAST. STORE AT ROOM TEMPERATURE.   EMTRICITABINE-TENOFOVIR AF (DESCOVY) 200-25 MG TABLET emtricitabine-tenofovir AF (DESCOVY) 200-25 MG tablet      Take 1 tablet by mouth daily.    TAKE ONE TABLET BY MOUTH ONCE DAILY WITH OR WITHOUT FOOD.  Discontinued Medications   No medications on file    Subjective: Adam Simon is in for his routine HIV follow-up visit.  As usual, he never has any problems obtaining, taking or tolerating his Descovy or Evotaz.  He never misses doses.  He will be retiring later this year and will transition to a Medicare plan when he turns 65 in March.  He says that he will probably be living in [place] after  retirement but keeping his house here in [place] as long as his parents are alive.  He would like to continue receiving care here.  He does not have a primary care provider.  Review of Systems: Review of Systems  Constitutional:  Negative for fever and weight loss.    Past Medical History:  Diagnosis Date   HIV (human immunodeficiency virus infection) ([place])    Sleep apnea     Social History   Tobacco Use   Smoking status: Never   Smokeless tobacco: Never  Substance Use Topics   Alcohol use: Yes    Alcohol/week: 2.0 standard drinks of alcohol    Types: 2 Standard drinks or equivalent per week    Comment: 2 per week   Drug use: No    No family history on file.  No Known Allergies  Health Maintenance  Topic Date Due   DTaP/Tdap/Td (1 - Tdap) Never done   Zoster Vaccines- Shingrix (1 of 2) Never done   COLONOSCOPY (Pts 45-67yrs Insurance coverage will need to be confirmed)  Never done   COVID-19 Vaccine (7 - 2023-24 season) 12/16/2021   INFLUENZA VACCINE  Completed   Hepatitis C Screening  Completed   HIV Screening  Completed   HPV VACCINES  Aged Out    Objective:  Vitals:   05/03/22 0951  BP: (!) 182/100  Pulse: 70  Resp: 16  Temp: 97.9 F (36.6 C)  TempSrc: Oral  SpO2: 99%  Weight: 223 lb (101.2 kg)   Body mass index is 29.42 kg/m.  Physical Exam Constitutional:      Comments: He is in good spirits as usual.  He has gained a little over 20 pounds in the past few years.  Cardiovascular:     Rate and Rhythm: Normal rate.  Pulmonary:     Effort: Pulmonary effort is normal.  Psychiatric:        Mood and Affect: Mood normal.     Lab Results Lab Results  Component Value Date   WBC 9.8 04/19/2022   HGB 14.8 04/19/2022   HCT 41.4 04/19/2022   MCV 100.7 (H) 04/19/2022   PLT 220 04/19/2022    Lab Results  Component Value Date   CREATININE 0.97 04/19/2022   BUN 20 04/19/2022   NA 137 04/19/2022   K 4.0 04/19/2022   CL 100 04/19/2022   CO2 30  04/19/2022    Lab Results  Component Value Date   ALT 88 (H) 04/19/2022   AST 59 (H) 04/19/2022   ALKPHOS 48 09/23/2015   BILITOT 2.1 (H) 04/19/2022    Lab Results  Component Value Date   CHOL 296 (H) 04/19/2022   HDL 44 04/19/2022   [place]  04/19/2022     Comment:     . LDL cholesterol not calculated. Triglyceride levels greater than 400 mg/dL invalidate calculated LDL results. . Reference range: <100 . Desirable range <100 mg/dL for primary prevention;   <70 mg/dL for patients with CHD or diabetic patients  with > or = 2 CHD risk factors. Marland Kitchen LDL-C is now calculated using the Martin-Hopkins  calculation, which is a validated novel method providing  better accuracy than the Friedewald equation in the  estimation of LDL-C.  Cresenciano Genre et al. Annamaria Helling. 8341;962(22): 2061-2068  (http://education.[facility].com/faq/FAQ164)    TRIG 528 (H) 04/19/2022   CHOLHDL 6.7 (H) 04/19/2022   Lab Results  Component Value Date   LABRPR NON-REACTIVE 04/19/2022   HIV 1 RNA Quant (Copies/mL)  Date Value  04/19/2022 33 (H)  04/05/2021 31 (H)  03/24/2020 103 (H)   CD4 T Cell Abs (/uL)  Date Value  04/19/2022 1,113  04/05/2021 1,093  03/24/2020 1,201     Problem List Items Addressed This Visit       High   Human immunodeficiency virus (HIV) disease - Primary    His infection has been under very good, long-term control.  He prefers to continue his current regimen and wants to follow-up here in 1 year after blood work.      Relevant Medications   emtricitabine-tenofovir AF (DESCOVY) 200-25 MG tablet   atazanavir-cobicistat (EVOTAZ) 300-150 MG tablet   Other Relevant Orders   CBC   T-helper cells (CD4) count (not at [facility])   Comprehensive metabolic panel   Lipid panel   RPR   HIV-1 RNA quant-no reflex-bld     Unprioritized   Dyslipidemia    With new research coming out by way of the REPRIEVE study regarding cardiovascular event risk reduction for PLWH, I discussed that  over the 8 year trial initiation of statin (pitavastatin) therapy was shown to reduce CV disease by 35% independent of other risk factors.   The 10-year ASCVD risk score (Arnett DK, et al., 2019) is: 33.5%   Values used to calculate the score:     Age: 67 years  Sex: Male     Is Non-Hispanic African American: No     Diabetic: No     Tobacco smoker: No     Systolic Blood Pressure: 182 mmHg     Is BP treated: Yes     HDL Cholesterol: 44 mg/dL     Total Cholesterol: 296 mg/dL  We discussed the patient's 10-year CVD Risk Score to be at least moderately elevated, and would benefit from intervention given HIV+ and > 67 yo.   Will proceed with lower-intensity statin given non-diabetic.         Relevant Medications   atorvastatin (LIPITOR) 10 MG tablet   Elevated liver enzymes    He has had elevated hepatic transaminases over the last several years.  Only drinks alcohol on occasion and never to excess.  I suspect that he may have hepatic steatosis.      Hypertension    His blood pressure is nowhere near goal.  I strongly encouraged him to get a primary care provider soon as possible.      Relevant Medications   atorvastatin (LIPITOR) 10 MG tablet      Cliffton Asters, MD [facility] for Infectious Disease [facility] [phone] pager   [phone] cell 05/03/2022, 10:15 AM

## 2022-05-03 NOTE — Assessment & Plan Note (Signed)
His blood pressure is nowhere near goal.  I strongly encouraged him to get a primary care provider soon as possible.

## 2022-05-03 NOTE — Assessment & Plan Note (Signed)
He has had elevated hepatic transaminases over the last several years.  Only drinks alcohol on occasion and never to excess.  I suspect that he may have hepatic steatosis.

## 2022-05-09 ENCOUNTER — Other Ambulatory Visit: Payer: Self-pay

## 2022-05-09 DIAGNOSIS — B20 Human immunodeficiency virus [HIV] disease: Secondary | ICD-10-CM

## 2022-05-09 MED ORDER — EVOTAZ 300-150 MG PO TABS: ORAL_TABLET | ORAL | 11 refills | Status: DC

## 2022-05-30 ENCOUNTER — Other Ambulatory Visit: Payer: Self-pay | Admitting: Internal Medicine

## 2022-05-30 DIAGNOSIS — A6000 Herpesviral infection of urogenital system, unspecified: Secondary | ICD-10-CM

## 2022-06-28 ENCOUNTER — Other Ambulatory Visit (HOSPITAL_COMMUNITY): Payer: Self-pay

## 2022-06-28 ENCOUNTER — Telehealth: Payer: Self-pay

## 2022-06-28 NOTE — Telephone Encounter (Signed)
Patient called office today stating he is having insurance change and will be getting Medicare. Was told that insurance "sort of covers" his Descovy. Is showing high copay.  Will forward message to pharmacy team. Patient would like call back to discuss if he can get medication assistance or if he will need medication change. Leatrice Jewels, RMA

## 2022-06-28 NOTE — Telephone Encounter (Signed)
I will reach out to patient to see if he qualifies for PAF

## 2022-07-04 ENCOUNTER — Other Ambulatory Visit (HOSPITAL_COMMUNITY): Payer: Self-pay

## 2022-07-11 ENCOUNTER — Other Ambulatory Visit (HOSPITAL_COMMUNITY): Payer: Self-pay

## 2023-02-02 ENCOUNTER — Telehealth: Payer: Self-pay

## 2023-02-02 NOTE — Telephone Encounter (Signed)
Called patient to schedule 1 year f/u (due around 04/2023) with lab work two weeks prior. Unable to reach and left a voicemail. Previously seen Dr. Orvan Falconer, so will need to establish a new visit with another provider.

## 2023-03-13 ENCOUNTER — Other Ambulatory Visit: Payer: Self-pay

## 2023-03-13 DIAGNOSIS — B20 Human immunodeficiency virus [HIV] disease: Secondary | ICD-10-CM

## 2023-03-13 DIAGNOSIS — Z113 Encounter for screening for infections with a predominantly sexual mode of transmission: Secondary | ICD-10-CM

## 2023-03-14 ENCOUNTER — Other Ambulatory Visit: Payer: Self-pay

## 2023-03-14 ENCOUNTER — Other Ambulatory Visit: Payer: Medicare Other

## 2023-03-14 DIAGNOSIS — Z113 Encounter for screening for infections with a predominantly sexual mode of transmission: Secondary | ICD-10-CM

## 2023-03-14 DIAGNOSIS — B20 Human immunodeficiency virus [HIV] disease: Secondary | ICD-10-CM

## 2023-03-16 LAB — COMPLETE METABOLIC PANEL WITH GFR
AG Ratio: 1.4 (calc) (ref 1.0–2.5)
ALT: 105 U/L — ABNORMAL HIGH (ref 9–46)
AST: 49 U/L — ABNORMAL HIGH (ref 10–35)
Albumin: 4.2 g/dL (ref 3.6–5.1)
Alkaline phosphatase (APISO): 94 U/L (ref 35–144)
BUN: 14 mg/dL (ref 7–25)
CO2: 27 mmol/L (ref 20–32)
Calcium: 9.1 mg/dL (ref 8.6–10.3)
Chloride: 104 mmol/L (ref 98–110)
Creat: 0.84 mg/dL (ref 0.70–1.35)
Globulin: 2.9 g/dL (ref 1.9–3.7)
Glucose, Bld: 117 mg/dL — ABNORMAL HIGH (ref 65–99)
Potassium: 4.3 mmol/L (ref 3.5–5.3)
Sodium: 139 mmol/L (ref 135–146)
Total Bilirubin: 1.1 mg/dL (ref 0.2–1.2)
Total Protein: 7.1 g/dL (ref 6.1–8.1)
eGFR: 97 mL/min/{1.73_m2} (ref >=60)

## 2023-03-16 LAB — CBC WITH DIFFERENTIAL/PLATELET
Absolute Lymphocytes: 4222 {cells}/uL — ABNORMAL HIGH (ref 850–3900)
Absolute Monocytes: 884 {cells}/uL (ref 200–950)
Basophils Absolute: 37 {cells}/uL (ref 0–200)
Basophils Relative: 0.4 %
Eosinophils Absolute: 186 {cells}/uL (ref 15–500)
Eosinophils Relative: 2 %
HCT: 39.1 % (ref 38.5–50.0)
Hemoglobin: 13.6 g/dL (ref 13.2–17.1)
MCH: 34.9 pg — ABNORMAL HIGH (ref 27.0–33.0)
MCHC: 34.8 g/dL (ref 32.0–36.0)
MCV: 100.3 fL — ABNORMAL HIGH (ref 80.0–100.0)
MPV: 10.6 fL (ref 7.5–12.5)
Monocytes Relative: 9.5 %
Neutro Abs: 3971 {cells}/uL (ref 1500–7800)
Neutrophils Relative %: 42.7 %
Platelets: 233 10*3/uL (ref 140–400)
RBC: 3.9 10*6/uL — ABNORMAL LOW (ref 4.20–5.80)
RDW: 13 % (ref 11.0–15.0)
Total Lymphocyte: 45.4 %
WBC: 9.3 10*3/uL (ref 3.8–10.8)

## 2023-03-16 LAB — HIV-1 RNA QUANT-NO REFLEX-BLD
HIV 1 RNA Quant: 35 {copies}/mL — ABNORMAL HIGH
HIV-1 RNA Quant, Log: 1.55 {Log_copies}/mL — ABNORMAL HIGH

## 2023-03-16 LAB — T-HELPER CELLS (CD4) COUNT (NOT AT ARMC)
Absolute CD4: 1521 {cells}/uL (ref 490–1740)
CD4 T Helper %: 34 % (ref 30–61)
Total lymphocyte count: 4440 {cells}/uL — ABNORMAL HIGH (ref 850–3900)

## 2023-03-16 LAB — RPR: RPR Ser Ql: NONREACTIVE

## 2023-03-20 ENCOUNTER — Other Ambulatory Visit: Payer: Self-pay

## 2023-04-03 ENCOUNTER — Other Ambulatory Visit (HOSPITAL_COMMUNITY): Payer: Self-pay

## 2023-04-03 ENCOUNTER — Encounter: Payer: Self-pay | Admitting: Internal Medicine

## 2023-04-03 ENCOUNTER — Ambulatory Visit: Payer: Medicare Other | Admitting: Internal Medicine

## 2023-04-03 ENCOUNTER — Other Ambulatory Visit: Payer: Self-pay

## 2023-04-03 ENCOUNTER — Other Ambulatory Visit: Payer: Self-pay | Admitting: Pharmacist

## 2023-04-03 VITALS — BP 138/90 | Ht 73.0 in | Wt 234.0 lb

## 2023-04-03 DIAGNOSIS — Z113 Encounter for screening for infections with a predominantly sexual mode of transmission: Secondary | ICD-10-CM | POA: Insufficient documentation

## 2023-04-03 DIAGNOSIS — R748 Abnormal levels of other serum enzymes: Secondary | ICD-10-CM

## 2023-04-03 DIAGNOSIS — E785 Hyperlipidemia, unspecified: Secondary | ICD-10-CM

## 2023-04-03 DIAGNOSIS — B20 Human immunodeficiency virus [HIV] disease: Secondary | ICD-10-CM

## 2023-04-03 MED ORDER — EVOTAZ 300-150 MG PO TABS
ORAL_TABLET | ORAL | 11 refills | Status: DC
  Filled 2023-04-03: qty 30, 30d supply, fill #0
  Filled 2023-04-20: qty 30, 30d supply, fill #1

## 2023-04-03 MED ORDER — DESCOVY 200-25 MG PO TABS
1.0000 | ORAL_TABLET | Freq: Every day | ORAL | 11 refills | Status: DC
  Filled 2023-04-03: qty 30, 30d supply, fill #0
  Filled 2023-04-20: qty 30, 30d supply, fill #1

## 2023-04-03 MED ORDER — ATORVASTATIN CALCIUM 10 MG PO TABS
10.0000 mg | ORAL_TABLET | Freq: Every day | ORAL | 11 refills | Status: DC
  Filled 2023-04-03: qty 30, 30d supply, fill #0

## 2023-04-03 MED ORDER — DESCOVY 200-25 MG PO TABS: 1.0000 | ORAL_TABLET | Freq: Every day | ORAL | 11 refills | Status: DC

## 2023-04-03 MED ORDER — EVOTAZ 300-150 MG PO TABS: ORAL_TABLET | ORAL | 11 refills | Status: DC

## 2023-04-03 MED ORDER — ATORVASTATIN CALCIUM 10 MG PO TABS: 10.0000 mg | ORAL_TABLET | Freq: Every day | ORAL | 11 refills | Status: DC

## 2023-04-03 NOTE — Progress Notes (Signed)
   Subjective:    Patient ID: Adam Simon, male    DOB: [birth year], 67 y.o.   MRN: [id number]  HPI Adam Simon is here for his yearly follow up of HIV He continues on Evotaz and Descovy with no missed doses.  Some issues with cost of Descovy.  No issues with taking or tolerating his medication.  No complaints today.  Started atorvastatin last year and no myalgias or reported side effects.  Moving to [facility] where he has a place but will continue to visit family here and keep his care here for now.    Review of Systems  Constitutional:  Negative for fatigue.  Gastrointestinal:  Negative for diarrhea.  Skin:  Negative for rash.       Objective:   Physical Exam Eyes:     General: No scleral icterus. Pulmonary:     Effort: Pulmonary effort is normal.  Neurological:     Mental Status: He is alert.   SH: no tobacco        Assessment & Plan:

## 2023-04-03 NOTE — Progress Notes (Signed)
Specialty Pharmacy Initial Fill Coordination Note  Adam Simon is a 67 y.o. male contacted today regarding initial fill of specialty medication(s) Atazanavir-Cobicistat Psychologist, forensic); Emtricitabine-Tenofovir AF (Descovy)   Patient requested Delivery   Delivery date: 04/05/23   Verified address: [address] [name] [address]   Medication will be filled on 04/04/23.   Patient is aware of $0 copayment.

## 2023-04-03 NOTE — Assessment & Plan Note (Signed)
Lipid panel reviewed with him and on a statin.

## 2023-04-03 NOTE — Assessment & Plan Note (Signed)
He continues  to do well and no concerns.   Refills provided and no cost through [facility] reviewed with him Follow up in 11 months.

## 2023-04-03 NOTE — Progress Notes (Signed)
Specialty Pharmacy Initiation Note   Adam Simon is a 67 y.o. male who will be followed by the specialty pharmacy service for RxSp HIV    Review of administration, indication, effectiveness, safety, potential side effects, storage/disposable, and missed dose instructions occurred today for patient's specialty medication(s) Atazanavir-Cobicistat (Evotaz); Emtricitabine-Tenofovir AF (Descovy)     Patient/Caregiver did not have any additional questions or concerns.   Patient's therapy is appropriate to: Initiate    Goals Addressed             This Visit's Progress    Achieve Undetectable HIV Viral Load < 20       Patient is on track. Patient will work on increased adherence.      Comply with lab assessments       Patient is on track. Patient will adhere to provider and/or lab appointments.      Improve or maintain quality of life       Patient is on track. Patient will be evaluated at upcoming provider appointment to assess progress         [name] L. Lyrah Bradt, PharmD, BCIDP, AAHIVP, CPP Clinical Pharmacist Practitioner Infectious Diseases Clinical Pharmacist [facility] 04/03/2023, 10:32 AM

## 2023-04-03 NOTE — Assessment & Plan Note (Signed)
Previous evaluation with no significant findings.  Likely related to fatty liver. Discussed exercise and healthy habits.

## 2023-04-03 NOTE — Assessment & Plan Note (Signed)
Screened negative 

## 2023-04-04 ENCOUNTER — Other Ambulatory Visit: Payer: Self-pay

## 2023-04-04 ENCOUNTER — Other Ambulatory Visit (HOSPITAL_COMMUNITY): Payer: Self-pay

## 2023-04-20 ENCOUNTER — Other Ambulatory Visit: Payer: Self-pay

## 2023-04-20 ENCOUNTER — Other Ambulatory Visit (HOSPITAL_COMMUNITY): Payer: Self-pay

## 2023-04-20 NOTE — Progress Notes (Signed)
 Specialty Pharmacy Refill Coordination Note  [name] is a 68 y.o. male contacted today regarding refills of specialty medication(s) Atazanavir -Cobicistat  (Evotaz ); Emtricitabine -Tenofovir  AF (Descovy )   Patient requested Delivery   Delivery date: 05/14/23   Verified address: [address] [place] [address]   Medication will be filled on 01.23.25.

## 2023-04-23 ENCOUNTER — Other Ambulatory Visit (HOSPITAL_COMMUNITY): Payer: Self-pay

## 2023-04-24 ENCOUNTER — Other Ambulatory Visit (HOSPITAL_COMMUNITY): Payer: Self-pay

## 2023-04-25 ENCOUNTER — Encounter: Payer: Self-pay | Admitting: Internal Medicine

## 2023-05-11 ENCOUNTER — Other Ambulatory Visit: Payer: Self-pay

## 2023-05-11 ENCOUNTER — Other Ambulatory Visit (HOSPITAL_COMMUNITY): Payer: Self-pay

## 2023-05-11 DIAGNOSIS — B20 Human immunodeficiency virus [HIV] disease: Secondary | ICD-10-CM

## 2023-05-11 MED ORDER — EVOTAZ 300-150 MG PO TABS: ORAL_TABLET | ORAL | 11 refills | Status: DC

## 2023-05-11 MED ORDER — DESCOVY 200-25 MG PO TABS: 1.0000 | ORAL_TABLET | Freq: Every day | ORAL | 11 refills | Status: DC

## 2023-05-11 NOTE — Progress Notes (Signed)
Patient will have to fill [facility] because he have RW/SPAP

## 2023-05-14 ENCOUNTER — Other Ambulatory Visit (HOSPITAL_COMMUNITY): Payer: Self-pay

## 2023-05-24 ENCOUNTER — Telehealth: Payer: Self-pay

## 2023-05-24 DIAGNOSIS — E785 Hyperlipidemia, unspecified: Secondary | ICD-10-CM

## 2023-05-24 MED ORDER — ATORVASTATIN CALCIUM 10 MG PO TABS: 10.0000 mg | ORAL_TABLET | Freq: Every day | ORAL | 5 refills | Status: DC

## 2023-05-24 NOTE — Telephone Encounter (Signed)
 Patient called office today requesting refill for Atorvastatin  be sent to [facility] on [address]. Prescription sent. Left vm updating pt. Julien Odor, RMA

## 2023-08-30 NOTE — Progress Notes (Signed)
 The 10-year ASCVD risk score (Arnett DK, et al., 2019) is: 24.4%   Values used to calculate the score:     Age: 68 years     Sex: Male     Is Non-Hispanic African American: No     Diabetic: No     Tobacco smoker: No     Systolic Blood Pressure: 138 mmHg     Is BP treated: Yes     HDL Cholesterol: 44 mg/dL     Total Cholesterol: 296 mg/dL  Currently prescribed atorvastatin  10 mg.   Jaren Kearn, BSN, RN

## 2023-10-28 ENCOUNTER — Other Ambulatory Visit: Payer: Self-pay | Admitting: Internal Medicine

## 2023-10-28 DIAGNOSIS — E785 Hyperlipidemia, unspecified: Secondary | ICD-10-CM

## 2024-02-15 ENCOUNTER — Other Ambulatory Visit: Payer: Self-pay

## 2024-02-15 DIAGNOSIS — Z113 Encounter for screening for infections with a predominantly sexual mode of transmission: Secondary | ICD-10-CM

## 2024-02-15 DIAGNOSIS — B20 Human immunodeficiency virus [HIV] disease: Secondary | ICD-10-CM

## 2024-02-15 DIAGNOSIS — Z79899 Other long term (current) drug therapy: Secondary | ICD-10-CM

## 2024-02-19 ENCOUNTER — Other Ambulatory Visit (HOSPITAL_COMMUNITY)
Admission: RE | Admit: 2024-02-19 | Discharge: 2024-02-19 | Disposition: A | Discharge status: Home / Self Care | Source: Ambulatory Visit | Attending: Infectious Disease | Admitting: Infectious Disease

## 2024-02-19 ENCOUNTER — Other Ambulatory Visit: Payer: Self-pay

## 2024-02-19 ENCOUNTER — Other Ambulatory Visit: Payer: Medicare Other

## 2024-02-19 DIAGNOSIS — Z79899 Other long term (current) drug therapy: Secondary | ICD-10-CM

## 2024-02-19 DIAGNOSIS — B20 Human immunodeficiency virus [HIV] disease: Secondary | ICD-10-CM | POA: Insufficient documentation

## 2024-02-19 DIAGNOSIS — Z113 Encounter for screening for infections with a predominantly sexual mode of transmission: Secondary | ICD-10-CM | POA: Diagnosis present

## 2024-02-19 LAB — T-HELPER CELL (CD4) - (RCID CLINIC ONLY)
CD4 % Helper T Cell: 31 % — ABNORMAL LOW (ref 33–65)
CD4 T Cell Abs: 1327 /uL (ref 400–1790)

## 2024-02-20 LAB — URINE CYTOLOGY ANCILLARY ONLY
Chlamydia: NEGATIVE
Comment: NEGATIVE
Comment: NORMAL
Neisseria Gonorrhea: NEGATIVE

## 2024-02-22 LAB — CBC WITH DIFFERENTIAL/PLATELET
Absolute Lymphocytes: 4612 {cells}/uL — ABNORMAL HIGH (ref 850–3900)
Absolute Monocytes: 888 {cells}/uL (ref 200–950)
Basophils Absolute: 43 {cells}/uL (ref 0–200)
Basophils Relative: 0.4 %
Eosinophils Absolute: 214 {cells}/uL (ref 15–500)
Eosinophils Relative: 2 %
HCT: 44 % (ref 38.5–50.0)
Hemoglobin: 15 g/dL (ref 13.2–17.1)
MCH: 34.3 pg — ABNORMAL HIGH (ref 27.0–33.0)
MCHC: 34.1 g/dL (ref 32.0–36.0)
MCV: 100.7 fL — ABNORMAL HIGH (ref 80.0–100.0)
MPV: 10.8 fL (ref 7.5–12.5)
Monocytes Relative: 8.3 %
Neutro Abs: 4943 {cells}/uL (ref 1500–7800)
Neutrophils Relative %: 46.2 %
Platelets: 241 Thousand/uL (ref 140–400)
RBC: 4.37 Million/uL (ref 4.20–5.80)
RDW: 12.8 % (ref 11.0–15.0)
Total Lymphocyte: 43.1 %
WBC: 10.7 Thousand/uL (ref 3.8–10.8)

## 2024-02-22 LAB — COMPLETE METABOLIC PANEL WITHOUT GFR
AG Ratio: 1.8 (calc) (ref 1.0–2.5)
ALT: 76 U/L — ABNORMAL HIGH (ref 9–46)
AST: 46 U/L — ABNORMAL HIGH (ref 10–35)
Albumin: 4.9 g/dL (ref 3.6–5.1)
Alkaline phosphatase (APISO): 74 U/L (ref 35–144)
BUN: 18 mg/dL (ref 7–25)
CO2: 28 mmol/L (ref 20–32)
Calcium: 9.7 mg/dL (ref 8.6–10.3)
Chloride: 101 mmol/L (ref 98–110)
Creat: 0.84 mg/dL (ref 0.70–1.35)
Globulin: 2.7 g/dL (ref 1.9–3.7)
Glucose, Bld: 109 mg/dL — ABNORMAL HIGH (ref 65–99)
Potassium: 4.2 mmol/L (ref 3.5–5.3)
Sodium: 138 mmol/L (ref 135–146)
Total Bilirubin: 0.9 mg/dL (ref 0.2–1.2)
Total Protein: 7.6 g/dL (ref 6.1–8.1)

## 2024-02-22 LAB — LIPID PANEL
Cholesterol: 170 mg/dL (ref <200)
HDL: 40 mg/dL (ref >=40)
LDL Cholesterol (Calc): 93 mg/dL
Non-HDL Cholesterol (Calc): 130 mg/dL — ABNORMAL HIGH (ref <130)
Total CHOL/HDL Ratio: 4.3 (calc) (ref <5.0)
Triglycerides: 247 mg/dL — ABNORMAL HIGH (ref <150)

## 2024-02-22 LAB — HIV-1 RNA QUANT-NO REFLEX-BLD
HIV 1 RNA Quant: <20 {copies}/mL — AB
HIV-1 RNA Quant, Log: <1.3 {Log_copies}/mL — AB

## 2024-02-22 LAB — RPR: RPR Ser Ql: NONREACTIVE

## 2024-03-04 ENCOUNTER — Ambulatory Visit: Admitting: Infectious Disease

## 2024-03-04 ENCOUNTER — Ambulatory Visit: Payer: Self-pay | Admitting: Internal Medicine

## 2024-03-18 ENCOUNTER — Ambulatory Visit: Admitting: Infectious Disease

## 2024-05-02 ENCOUNTER — Telehealth: Payer: Self-pay

## 2024-05-02 DIAGNOSIS — B20 Human immunodeficiency virus [HIV] disease: Secondary | ICD-10-CM

## 2024-05-02 DIAGNOSIS — E785 Hyperlipidemia, unspecified: Secondary | ICD-10-CM

## 2024-05-02 MED ORDER — EVOTAZ 300-150 MG PO TABS: ORAL_TABLET | ORAL | 0 refills | Status: AC

## 2024-05-02 MED ORDER — DESCOVY 200-25 MG PO TABS: 1.0000 | ORAL_TABLET | Freq: Every day | ORAL | 0 refills | Status: AC

## 2024-05-02 MED ORDER — ATORVASTATIN CALCIUM 10 MG PO TABS: ORAL_TABLET | ORAL | 0 refills | Status: AC

## 2024-05-02 NOTE — Telephone Encounter (Signed)
 Patient called requesting refills of Evotaz , Descovy , and atorvastatin . Previous [facility] patient, scheduled to see Dr. Fleeta Rothman on 1/21.  Arish Redner, BSN, RN

## 2024-05-05 ENCOUNTER — Other Ambulatory Visit: Payer: Self-pay | Admitting: Infectious Disease

## 2024-05-05 DIAGNOSIS — E785 Hyperlipidemia, unspecified: Secondary | ICD-10-CM

## 2024-05-07 ENCOUNTER — Ambulatory Visit: Admitting: Infectious Disease

## 2024-07-09 ENCOUNTER — Ambulatory Visit: Admitting: Infectious Disease
